# Patient Record
Sex: Female | Born: 1950 | Race: Black or African American | Hispanic: No | Marital: Single | Smoking: Current every day smoker
Health system: Southern US, Community
[De-identification: ages and names within clinical notes are randomized; demographics above are authoritative.]

## PROBLEM LIST (undated history)

## (undated) DIAGNOSIS — R569 Unspecified convulsions: Secondary | ICD-10-CM

## (undated) DIAGNOSIS — I10 Essential (primary) hypertension: Secondary | ICD-10-CM

## (undated) DIAGNOSIS — E785 Hyperlipidemia, unspecified: Secondary | ICD-10-CM

## (undated) DIAGNOSIS — M1990 Unspecified osteoarthritis, unspecified site: Secondary | ICD-10-CM

## (undated) DIAGNOSIS — K219 Gastro-esophageal reflux disease without esophagitis: Secondary | ICD-10-CM

## (undated) HISTORY — DX: Unspecified convulsions: R56.9

## (undated) HISTORY — PX: COLON SURGERY: SHX602

## (undated) HISTORY — PX: JOINT REPLACEMENT: SHX530

## (undated) HISTORY — PX: APPENDECTOMY: SHX54

---

## 2021-05-13 ENCOUNTER — Other Ambulatory Visit: Payer: Self-pay

## 2021-05-13 ENCOUNTER — Encounter (HOSPITAL_BASED_OUTPATIENT_CLINIC_OR_DEPARTMENT_OTHER): Payer: Self-pay | Admitting: *Deleted

## 2021-05-13 ENCOUNTER — Emergency Department (HOSPITAL_BASED_OUTPATIENT_CLINIC_OR_DEPARTMENT_OTHER)
Admission: EM | Admit: 2021-05-13 | Discharge: 2021-05-13 | Disposition: A | Discharge status: Home / Self Care | Payer: 59 | Attending: Emergency Medicine | Admitting: Emergency Medicine

## 2021-05-13 DIAGNOSIS — F17210 Nicotine dependence, cigarettes, uncomplicated: Secondary | ICD-10-CM | POA: Diagnosis not present

## 2021-05-13 DIAGNOSIS — Z91040 Latex allergy status: Secondary | ICD-10-CM | POA: Insufficient documentation

## 2021-05-13 DIAGNOSIS — L255 Unspecified contact dermatitis due to plants, except food: Secondary | ICD-10-CM | POA: Insufficient documentation

## 2021-05-13 DIAGNOSIS — R21 Rash and other nonspecific skin eruption: Secondary | ICD-10-CM | POA: Diagnosis present

## 2021-05-13 DIAGNOSIS — L237 Allergic contact dermatitis due to plants, except food: Secondary | ICD-10-CM

## 2021-05-13 DIAGNOSIS — Z79899 Other long term (current) drug therapy: Secondary | ICD-10-CM | POA: Diagnosis not present

## 2021-05-13 DIAGNOSIS — I10 Essential (primary) hypertension: Secondary | ICD-10-CM | POA: Diagnosis not present

## 2021-05-13 DIAGNOSIS — Z7982 Long term (current) use of aspirin: Secondary | ICD-10-CM | POA: Diagnosis not present

## 2021-05-13 HISTORY — DX: Essential (primary) hypertension: I10

## 2021-05-13 HISTORY — DX: Hyperlipidemia, unspecified: E78.5

## 2021-05-13 HISTORY — DX: Gastro-esophageal reflux disease without esophagitis: K21.9

## 2021-05-13 HISTORY — DX: Unspecified osteoarthritis, unspecified site: M19.90

## 2021-05-13 MED ORDER — PREDNISONE 10 MG (21) PO TBPK: ORAL_TABLET | ORAL | 0 refills | Status: DC

## 2021-05-13 NOTE — Discharge Instructions (Addendum)
Take prednisone as directed.  Follow-up with primary care doctor.  Return to emergency department for any worsening rash, difficulty breathing, tongue or lip swelling or any other worsening concerning symptoms.

## 2021-05-13 NOTE — ED Triage Notes (Signed)
Here for evaluation of a rash, states it is all over her body, first noted this past Saturday, states she has been working in her yard. Has itching, and burning sensation at raised, non draining area.

## 2021-05-13 NOTE — ED Provider Notes (Signed)
[facility] EMERGENCY DEPARTMENT Provider Note   CSN: [id number] Arrival date & time: 05/13/21  1817     History Chief Complaint  Patient presents with   Rash    Yolanda Keith is a 70 y.o. female past medical history arthritis, GERD, hyperlipidemia, hypertension who presents for evaluation of diffuse rash.  She reports a week ago, she was out working in her garden.  She states that she saw poison ivy and she did pick it up.  She reports that the next day, she started having pain, itching, rash noted to her bilateral arms.  She states that the area was pruritic and painful.  She states that it is since spread.  She states that she has been using calamine lotion but it is still very pruritic.  She denies any new medications.  No new soaps, lotions, detergents.  She has not had any swelling of her tongue or lips, difficulty breathing.  The history is provided by the patient.      Past Medical History:  Diagnosis Date   Arthritis    GERD (gastroesophageal reflux disease)    Hyperlipidemia    Hypertension     There are no problems to display for this patient.   Past Surgical History:  Procedure Laterality Date   APPENDECTOMY     COLON SURGERY     JOINT REPLACEMENT       OB History   No obstetric history on file.     History reviewed. No pertinent family history.  Social History   Tobacco Use   Smoking status: Some Days    Types: Cigarettes   Smokeless tobacco: Never  Vaping Use   Vaping Use: Never used  Substance Use Topics   Alcohol use: Yes    Comment: social   Drug use: Never    Home Medications Prior to Admission medications   Medication Sig Start Date End Date Taking? Authorizing Provider  amLODipine (NORVASC) 10 MG tablet Take 10 mg by mouth daily.   Yes [provider]  aspirin 81 MG chewable tablet Chew by mouth daily.   Yes [provider]  busPIRone (BUSPAR) 10 MG tablet Take 10 mg by mouth 3 (three) times daily.   Yes  [provider]  hydrochlorothiazide (HYDRODIURIL) 25 MG tablet Take 25 mg by mouth daily.   Yes [provider]  hydrOXYzine (ATARAX/VISTARIL) 25 MG tablet Take 25 mg by mouth 3 (three) times daily as needed.   Yes [provider]  levETIRAcetam (KEPPRA) 500 MG tablet Take 500 mg by mouth 2 (two) times daily.   Yes [provider]  metoprolol tartrate (LOPRESSOR) 25 MG tablet Take 25 mg by mouth 2 (two) times daily.   Yes [provider]  omeprazole (PRILOSEC) 40 MG capsule Take 40 mg by mouth daily.   Yes [provider]  potassium chloride (KLOR-CON) 20 MEQ packet Take by mouth 2 (two) times daily.   Yes [provider]  predniSONE (STERAPRED UNI-PAK 21 TAB) 10 MG (21) TBPK tablet Take 6 tabs by mouth daily  for 2 days, then 5 tabs for 2 days, then 4 tabs for 2 days, then 3 tabs for 2 days, 2 tabs for 2 days, then 1 tab by mouth daily for 2 days 05/13/21  Yes Graciella Freer A, PA-C  pregabalin (LYRICA) 50 MG capsule Take 50 mg by mouth 3 (three) times daily.   Yes [provider]    Allergies    Penicillins, Latex, Sulfa  antibiotics, and Zinc  Review of Systems   Review of Systems  Constitutional:  Negative for fever.  HENT:  Negative for trouble swallowing.   Respiratory:  Negative for shortness of breath.   Skin:  Positive for rash.  All other systems reviewed and are negative.  Physical Exam Updated Vital Signs BP (!) 164/109 (BP Location: Right Arm)   Pulse 92   Temp 98.2 F (36.8 C) (Oral)   Resp 20   Ht 5\' 3"  (1.6 m)   Wt 72.6 kg   SpO2 98%   BMI 28.34 kg/m   Physical Exam Vitals and nursing note reviewed.  Constitutional:      Appearance: She is well-developed.  HENT:     Head: Normocephalic and atraumatic.     Mouth/Throat:     Comments: Posterior pharynx is clear without any signs of erythema, edema.  Uvula is midline.  Airways patent, phonation is intact. Eyes:     General: No scleral  icterus.       Right eye: No discharge.        Left eye: No discharge.     Conjunctiva/sclera: Conjunctivae normal.  Pulmonary:     Effort: Pulmonary effort is normal.     Comments: Lungs clear to auscultation bilaterally.  Symmetric chest rise.  No wheezing, rales, rhonchi. Skin:    General: Skin is warm and dry.     Comments: Scattered areas of linear erythematous rash with a mix of papules, excoriations to her arms, legs, neck, thighs.  Neurological:     Mental Status: She is alert.  Psychiatric:        Speech: Speech normal.        Behavior: Behavior normal.    ED Results / Procedures / Treatments   Labs (all labs ordered are listed, but only abnormal results are displayed) Labs Reviewed - No data to display  EKG None  Radiology No results found.  Procedures Procedures   Medications Ordered in ED Medications - No data to display  ED Course  I have reviewed the triage vital signs and the nursing notes.  Pertinent labs & imaging results that were available during my care of the patient were reviewed by me and considered in my medical decision making (see chart for details).    MDM Rules/Calculators/A&P                           70 year old female who presents for evaluation of diffuse rash that has been ongoing for about a week.  She states that it is pruritic in nature.  She reports that prior to onset of rash, she was in the garden and noted to be exposed to poison ivy.  No fever, oral angioedema, difficulty breathing.  On initial arrival, she is afebrile nontoxic-appearing.  Vital signs are stable.  On exam, she has scattered excoriations, linear numbness papules consistent with poison ivy noted to her arms, neck, legs.  No oral lesions.  No oral angioedema.  History/physical exam concerning for shingles, cellulitis, scabies.  We will plan to treat with a prednisone taper.  Patient with no history of diabetes. At this time, patient exhibits no emergent  life-threatening condition that require further evaluation in ED. Patient had ample opportunity for questions and discussion. All patient's questions were answered with full understanding. Strict return precautions discussed. Patient expresses understanding and agreement to plan.   Portions of this note were generated with 78. Dictation errors  may occur despite best attempts at proofreading.   Final Clinical Impression(s) / ED Diagnoses Final diagnoses:  Poison ivy    Rx / DC Orders ED Discharge Orders          Ordered    predniSONE (STERAPRED UNI-PAK 21 TAB) 10 MG (21) TBPK tablet        05/13/21 1855             Maxwell Caul, PA-C 05/13/21 2309    Virgina Norfolk, DO 05/16/21 1502

## 2021-05-23 ENCOUNTER — Other Ambulatory Visit: Payer: Self-pay

## 2021-05-23 ENCOUNTER — Other Ambulatory Visit (HOSPITAL_BASED_OUTPATIENT_CLINIC_OR_DEPARTMENT_OTHER): Payer: Self-pay

## 2021-05-23 ENCOUNTER — Emergency Department (HOSPITAL_BASED_OUTPATIENT_CLINIC_OR_DEPARTMENT_OTHER)
Admission: EM | Admit: 2021-05-23 | Discharge: 2021-05-23 | Disposition: A | Discharge status: Home / Self Care | Payer: Medicare Other | Attending: Emergency Medicine | Admitting: Emergency Medicine

## 2021-05-23 ENCOUNTER — Emergency Department (HOSPITAL_BASED_OUTPATIENT_CLINIC_OR_DEPARTMENT_OTHER): Payer: Medicare Other

## 2021-05-23 ENCOUNTER — Encounter (HOSPITAL_BASED_OUTPATIENT_CLINIC_OR_DEPARTMENT_OTHER): Payer: Self-pay

## 2021-05-23 DIAGNOSIS — Z7982 Long term (current) use of aspirin: Secondary | ICD-10-CM | POA: Insufficient documentation

## 2021-05-23 DIAGNOSIS — Z91040 Latex allergy status: Secondary | ICD-10-CM | POA: Diagnosis not present

## 2021-05-23 DIAGNOSIS — S8011XA Contusion of right lower leg, initial encounter: Secondary | ICD-10-CM | POA: Diagnosis not present

## 2021-05-23 DIAGNOSIS — L237 Allergic contact dermatitis due to plants, except food: Secondary | ICD-10-CM | POA: Insufficient documentation

## 2021-05-23 DIAGNOSIS — X58XXXA Exposure to other specified factors, initial encounter: Secondary | ICD-10-CM | POA: Diagnosis not present

## 2021-05-23 DIAGNOSIS — I10 Essential (primary) hypertension: Secondary | ICD-10-CM | POA: Diagnosis not present

## 2021-05-23 DIAGNOSIS — S40022A Contusion of left upper arm, initial encounter: Secondary | ICD-10-CM | POA: Diagnosis not present

## 2021-05-23 DIAGNOSIS — F17210 Nicotine dependence, cigarettes, uncomplicated: Secondary | ICD-10-CM | POA: Diagnosis not present

## 2021-05-23 DIAGNOSIS — Z79899 Other long term (current) drug therapy: Secondary | ICD-10-CM | POA: Insufficient documentation

## 2021-05-23 DIAGNOSIS — M25551 Pain in right hip: Secondary | ICD-10-CM | POA: Diagnosis not present

## 2021-05-23 DIAGNOSIS — M25461 Effusion, right knee: Secondary | ICD-10-CM | POA: Diagnosis not present

## 2021-05-23 DIAGNOSIS — S8991XA Unspecified injury of right lower leg, initial encounter: Secondary | ICD-10-CM | POA: Diagnosis present

## 2021-05-23 DIAGNOSIS — Z96653 Presence of artificial knee joint, bilateral: Secondary | ICD-10-CM | POA: Insufficient documentation

## 2021-05-23 LAB — BASIC METABOLIC PANEL
Anion gap: 9 (ref 5–15)
BUN: 30 mg/dL — ABNORMAL HIGH (ref 8–23)
CO2: 29 mmol/L (ref 22–32)
Calcium: 9.6 mg/dL (ref 8.9–10.3)
Chloride: 102 mmol/L (ref 98–111)
Creatinine, Ser: 0.96 mg/dL (ref 0.44–1.00)
GFR, Estimated: >60 mL/min (ref >60)
Glucose, Bld: 113 mg/dL — ABNORMAL HIGH (ref 70–99)
Potassium: 4.8 mmol/L (ref 3.5–5.1)
Sodium: 140 mmol/L (ref 135–145)

## 2021-05-23 LAB — APTT: aPTT: 28 seconds (ref 24–36)

## 2021-05-23 LAB — CBC WITH DIFFERENTIAL/PLATELET
Abs Immature Granulocytes: 0.25 10*3/uL — ABNORMAL HIGH (ref 0.00–0.07)
Basophils Absolute: 0 10*3/uL (ref 0.0–0.1)
Basophils Relative: 0 %
Eosinophils Absolute: 0 10*3/uL (ref 0.0–0.5)
Eosinophils Relative: 0 %
HCT: 36.2 % (ref 36.0–46.0)
Hemoglobin: 11.6 g/dL — ABNORMAL LOW (ref 12.0–15.0)
Immature Granulocytes: 2 %
Lymphocytes Relative: 14 %
Lymphs Abs: 2.1 10*3/uL (ref 0.7–4.0)
MCH: 27.9 pg (ref 26.0–34.0)
MCHC: 32 g/dL (ref 30.0–36.0)
MCV: 87 fL (ref 80.0–100.0)
Monocytes Absolute: 0.4 10*3/uL (ref 0.1–1.0)
Monocytes Relative: 3 %
Neutro Abs: 11.8 10*3/uL — ABNORMAL HIGH (ref 1.7–7.7)
Neutrophils Relative %: 81 %
Platelets: 234 10*3/uL (ref 150–400)
RBC: 4.16 MIL/uL (ref 3.87–5.11)
RDW: 13.6 % (ref 11.5–15.5)
WBC: 14.6 10*3/uL — ABNORMAL HIGH (ref 4.0–10.5)
nRBC: 0 % (ref 0.0–0.2)

## 2021-05-23 LAB — PROTIME-INR
INR: 0.8 (ref 0.8–1.2)
Prothrombin Time: 11.3 seconds — ABNORMAL LOW (ref 11.4–15.2)

## 2021-05-23 MED ORDER — PREDNISONE 20 MG PO TABS
20.0000 mg | ORAL_TABLET | Freq: Every day | ORAL | 0 refills | Status: DC
  Filled 2021-05-23: qty 3, 3d supply, fill #0

## 2021-05-23 MED ORDER — HYDROCODONE-ACETAMINOPHEN 5-325 MG PO TABS
1.0000 | ORAL_TABLET | Freq: Once | ORAL | Status: AC
  Administered 2021-05-23: 1 via ORAL
  Filled 2021-05-23: qty 1

## 2021-05-23 MED ORDER — HYDROCODONE-ACETAMINOPHEN 5-325 MG PO TABS
1.0000 | ORAL_TABLET | Freq: Four times a day (QID) | ORAL | 0 refills | Status: DC | PRN
  Filled 2021-05-23: qty 10, 3d supply, fill #0

## 2021-05-23 NOTE — ED Notes (Signed)
Acute on chronic right hip/leg pain, patient also reports itching from poison ivy which she was seen for here last week and has 1 more day of steroids left.

## 2021-05-23 NOTE — ED Notes (Signed)
Patient transported to CT 

## 2021-05-23 NOTE — Discharge Instructions (Addendum)
A few more days of prednisone for the poison ivy but you were also given some pain medication for the knee and hip but looks like your arthritis is acting up.  Use the knee brace to help support the leg but also follow up with your new doctor in the future because you may need physical therapy.

## 2021-05-23 NOTE — ED Provider Notes (Signed)
[facility] EMERGENCY DEPARTMENT Provider Note   CSN: [id number] Arrival date & time: 05/23/21  1209     History Chief Complaint  Patient presents with   Rash    Yolanda Keith is a 70 y.o. female.  Patient is a 70 year old female with a history of hypertension, hyperlipidemia, chronic pain with constant back issues, bilateral knee replacements and back and pelvis pain frequently who was seen in the emergency department last week due to poison ivy and has been on a prednisone taper but she returns today because of ongoing itching and issues with poison ivy over her hands as well as 3 days of more severe right hip pain that radiates down to the knee, worse with walking and her knee is intermittently giving out.  She has tried over-the-counter medication but it does not seem to help with the pain.  Walking makes it worse it is better at rest.  She did have a fall 2 weeks ago and has had pain but much more severe in the last few days.  No trauma since that time.  No numbness or swelling in the leg but she does report it feels a little bit more weak.  No symptoms in the left leg today.  No right arm numbness or facial symptoms.  No speech difficulty.  Other than the prednisone no other change in medications.  She has also noticed significant bruising that she is not sure where it came from. Still currently on prednisone  The history is provided by the patient and medical records.  Rash     Past Medical History:  Diagnosis Date   Arthritis    GERD (gastroesophageal reflux disease)    Hyperlipidemia    Hypertension     There are no problems to display for this patient.   Past Surgical History:  Procedure Laterality Date   APPENDECTOMY     COLON SURGERY     JOINT REPLACEMENT       OB History   No obstetric history on file.     No family history on file.  Social History   Tobacco Use   Smoking status: Some Days    Types: Cigarettes   Smokeless tobacco: Never   Vaping Use   Vaping Use: Never used  Substance Use Topics   Alcohol use: Yes    Comment: social   Drug use: Never    Home Medications Prior to Admission medications   Medication Sig Start Date End Date Taking? Authorizing Provider  amLODipine (NORVASC) 10 MG tablet Take 10 mg by mouth daily.    [provider]  aspirin 81 MG chewable tablet Chew by mouth daily.    [provider]  busPIRone (BUSPAR) 10 MG tablet Take 10 mg by mouth 3 (three) times daily.    [provider]  hydrochlorothiazide (HYDRODIURIL) 25 MG tablet Take 25 mg by mouth daily.    [provider]  hydrOXYzine (ATARAX/VISTARIL) 25 MG tablet Take 25 mg by mouth 3 (three) times daily as needed.    [provider]  levETIRAcetam (KEPPRA) 500 MG tablet Take 500 mg by mouth 2 (two) times daily.    [provider]  metoprolol tartrate (LOPRESSOR) 25 MG tablet Take 25 mg by mouth 2 (two) times daily.    [provider]  omeprazole (PRILOSEC) 40 MG capsule Take 40 mg by mouth daily.    [provider]  potassium chloride (KLOR-CON) 20 MEQ packet Take by mouth 2 (two) times daily.  [provider]  predniSONE (STERAPRED UNI-PAK 21 TAB) 10 MG (21) TBPK tablet Take 6 tabs by mouth daily  for 2 days, then 5 tabs for 2 days, then 4 tabs for 2 days, then 3 tabs for 2 days, 2 tabs for 2 days, then 1 tab by mouth daily for 2 days 05/13/21   Graciella Freer A, PA-C  pregabalin (LYRICA) 50 MG capsule Take 50 mg by mouth 3 (three) times daily.    [provider]    Allergies    Penicillins, Latex, Sulfa antibiotics, and Zinc  Review of Systems   Review of Systems  Skin:  Positive for rash.  All other systems reviewed and are negative.  Physical Exam Updated Vital Signs BP 121/86 (BP Location: Right Arm)   Pulse 74   Temp 99.6 F (37.6 C) (Oral)   Resp 20   Ht 5' 3.5" (1.613 m)   Wt 72.6 kg   SpO2 96%   BMI 27.90 kg/m   Physical  Exam Vitals and nursing note reviewed.  Constitutional:      General: She is in acute distress.     Appearance: She is well-developed.  HENT:     Head: Normocephalic and atraumatic.     Mouth/Throat:     Mouth: Mucous membranes are moist.  Eyes:     Pupils: Pupils are equal, round, and reactive to light.  Cardiovascular:     Rate and Rhythm: Normal rate and regular rhythm.     Heart sounds: Normal heart sounds. No murmur heard.   No friction rub.  Pulmonary:     Effort: Pulmonary effort is normal.     Breath sounds: Normal breath sounds. No wheezing or rales.  Abdominal:     General: Bowel sounds are normal. There is no distension.     Palpations: Abdomen is soft.     Tenderness: There is no abdominal tenderness. There is no guarding or rebound.  Musculoskeletal:        General: No tenderness. Normal range of motion.     Cervical back: Normal range of motion and neck supple. No tenderness.     Comments: No edema.  Pain with ROM of the right hip.  Tenderness with axial loading of the right foot.  Able to lift the leg but unclear if theres is true weakness or related to pain as pt can push down firmly and normal foot flex/ext.  Right knee with mild tenderness but no swelling and ROM to >90 degrees of flexion without difficulty.  Skin:    General: Skin is warm and dry.     Findings: Rash present.     Comments: Dried excoriated patches over bilateral hands and behind the left ear and neck area.  No active area of lesions.  Large area of ecchymosis over the right calf and the left upper arm  Neurological:     Mental Status: She is alert and oriented to person, place, and time.     Cranial Nerves: No cranial nerve deficit.     Sensory: No sensory deficit.     Motor: No weakness.     Coordination: Coordination normal.     Comments: No speech deficits.  No facial droop or sensation abnormalities.  Psychiatric:        Behavior: Behavior normal.    ED Results / Procedures / Treatments    Labs (all labs ordered are listed, but only abnormal results are displayed) Labs Reviewed  CBC WITH DIFFERENTIAL/PLATELET - Abnormal; Notable  for the following components:      Result Value   WBC 14.6 (*)    Hemoglobin 11.6 (*)    Neutro Abs 11.8 (*)    Abs Immature Granulocytes 0.25 (*)    All other components within normal limits  BASIC METABOLIC PANEL - Abnormal; Notable for the following components:   Glucose, Bld 113 (*)    BUN 30 (*)    All other components within normal limits  PROTIME-INR - Abnormal; Notable for the following components:   Prothrombin Time 11.3 (*)    All other components within normal limits  APTT    EKG None  Radiology DG Knee Complete 4 Views Right  Result Date: 05/23/2021 CLINICAL DATA:  Pain EXAM: RIGHT KNEE - COMPLETE 4+ VIEW COMPARISON:  None. FINDINGS: Right total knee arthroplasty is present. Alignment is anatomic. No periprosthetic fracture. Possible joint effusion. Superior patellar spurring. Vascular calcifications. IMPRESSION: Right total knee arthroplasty without evidence of complication. Possible joint effusion. Electronically Signed   By: Guadlupe Spanish M.D.   On: 05/23/2021 13:32   DG Hip Unilat W or Wo Pelvis 2-3 Views Right  Result Date: 05/23/2021 CLINICAL DATA:  pain EXAM: DG HIP (WITH OR WITHOUT PELVIS) 2-3V RIGHT COMPARISON:  None. FINDINGS: No evidence of acute fracture or joint malalignment. Mild bilateral hip degenerative change. Partially imaged lower lumbar degenerative change. Mild bilateral sacroiliac and pubic symphysis degenerative change. Atherosclerotic vascular calcifications. Possible chain sutures projecting along the midline anatomic pelvis. IMPRESSION: 1. No evidence of acute fracture or traumatic malalignment 2. Mild bilateral hip degenerative change. Electronically Signed   By: Feliberto Harts MD   On: 05/23/2021 13:29    Procedures Procedures   Medications Ordered in ED Medications  HYDROcodone-acetaminophen  (NORCO/VICODIN) 5-325 MG per tablet 1 tablet (has no administration in time range)    ED Course  I have reviewed the triage vital signs and the nursing notes.  Pertinent labs & imaging results that were available during my care of the patient were reviewed by me and considered in my medical decision making (see chart for details).    MDM Rules/Calculators/A&P                           Patient is a 70 year old female presenting today with 2 separate issues.  She initially came feeling like her poison ivy was not getting better.  Patient is still currently on prednisone and most of the patches of poison ivy on her hands and on her neck and under her ears appears to be drying up and improving.  She secondly complains of pain in her right hip that is much worse in the last 3 to 4 days and her knee has been giving out.  She did have a fall 2 weeks ago but no other trauma.  She has chronic back and leg pain and 2 prior knee replacements on the right.  She has no evidence of swelling or concern for DVT at this time.  Large area of ecchymosis over the right calf which is mildly tender to palpation.  Patient can lift her leg off the bed but appears to be painful.  Unclear if there is true weakness versus significant pain.  It is more painful with passive range of motion.  Some pain in the hip with axial loading.  Patient can fully flex and extend the right knee actively without requiring any help.  No evidence of septic joint at this time.  Possibility for a lumbar radiculopathy versus acute hip pathology.  Patient is a also concern for knee issues.  Pulses intact no distal edema.  We will send labs to ensure no evidence of low platelets or bleeding dyscrasia given the recent bruising.  We will also check to ensure blood sugar is okay with recent prednisone use.  Patient given a dose of pain control.  Otherwise well-appearing here.  No foot drop today or symptoms to suggest stroke.  11:22 PM Mild leukocytosis  due to recent steroid use but stable hemoglobin, blood sugar is normal.  X-ray show evidence of arthritis and mild effusion but no other acute issues at this time.  Patient placed in a knee sleeve.  Encouraged follow-up with PCP and given a short course of pain control.  MDM   Amount and/or Complexity of Data Reviewed Clinical lab tests: ordered and reviewed Tests in the radiology section of CPT: ordered and reviewed Independent visualization of images, tracings, or specimens: yes    Final Clinical Impression(s) / ED Diagnoses Final diagnoses:  Poison ivy dermatitis  Effusion of right knee joint  Right hip pain    Rx / DC Orders ED Discharge Orders          Ordered    HYDROcodone-acetaminophen (NORCO/VICODIN) 5-325 MG tablet  Every 6 hours PRN        05/23/21 1534    predniSONE (DELTASONE) 20 MG tablet  Daily        05/23/21 1534             Gwyneth SproutPlunkett, Brycelyn Gambino, MD 05/24/21 2322

## 2021-05-23 NOTE — ED Triage Notes (Signed)
"  Was here a week ago for poison ivy, finished steroids, but rash is getting worse and I am getting weakness in my right leg" per pt

## 2021-11-12 ENCOUNTER — Encounter (HOSPITAL_BASED_OUTPATIENT_CLINIC_OR_DEPARTMENT_OTHER): Payer: Self-pay | Admitting: Emergency Medicine

## 2021-11-12 ENCOUNTER — Other Ambulatory Visit: Payer: Self-pay

## 2021-11-12 ENCOUNTER — Emergency Department (HOSPITAL_BASED_OUTPATIENT_CLINIC_OR_DEPARTMENT_OTHER): Payer: Medicare Other

## 2021-11-12 ENCOUNTER — Emergency Department (HOSPITAL_BASED_OUTPATIENT_CLINIC_OR_DEPARTMENT_OTHER)
Admission: EM | Admit: 2021-11-12 | Discharge: 2021-11-12 | Disposition: A | Discharge status: Home / Self Care | Payer: Medicare Other | Source: Home / Self Care | Attending: Emergency Medicine | Admitting: Emergency Medicine

## 2021-11-12 DIAGNOSIS — W19XXXA Unspecified fall, initial encounter: Secondary | ICD-10-CM | POA: Insufficient documentation

## 2021-11-12 DIAGNOSIS — S3210XA Unspecified fracture of sacrum, initial encounter for closed fracture: Secondary | ICD-10-CM | POA: Insufficient documentation

## 2021-11-12 DIAGNOSIS — Z79899 Other long term (current) drug therapy: Secondary | ICD-10-CM | POA: Insufficient documentation

## 2021-11-12 DIAGNOSIS — Z91040 Latex allergy status: Secondary | ICD-10-CM | POA: Insufficient documentation

## 2021-11-12 DIAGNOSIS — Z7982 Long term (current) use of aspirin: Secondary | ICD-10-CM | POA: Insufficient documentation

## 2021-11-12 DIAGNOSIS — K2970 Gastritis, unspecified, without bleeding: Secondary | ICD-10-CM | POA: Diagnosis not present

## 2021-11-12 DIAGNOSIS — R1011 Right upper quadrant pain: Secondary | ICD-10-CM | POA: Diagnosis not present

## 2021-11-12 MED ORDER — HYDROCODONE-ACETAMINOPHEN 5-325 MG PO TABS
2.0000 | ORAL_TABLET | Freq: Once | ORAL | Status: AC
  Administered 2021-11-12: 2 via ORAL
  Filled 2021-11-12: qty 2
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
## 2021-11-12 NOTE — Discharge Instructions (Signed)
Your x-ray shows a fracture of your sacrum.  If you develop uncontrolled pain, weakness or numbness or pain going down your legs, incontinence of your bladder or bowels, or any other new/concerning symptoms then return to the ER for evaluation.

## 2021-11-12 NOTE — ED Notes (Signed)
Patient discharged to home.  All discharge instructions reviewed.  Patient verbalized understanding via teachback method.  VS WDL.  Respirations even and unlabored.  Wheelchair out of ED.   ?

## 2021-11-12 NOTE — ED Triage Notes (Signed)
Pt arrives pov, to triage in wheelchair, c/o fall x 3 days pta. Pt c/o right elbow pain and  and coccyx pain. Pt denies hitting head, or thinners

## 2021-11-14 ENCOUNTER — Emergency Department (HOSPITAL_BASED_OUTPATIENT_CLINIC_OR_DEPARTMENT_OTHER): Payer: Medicare Other

## 2021-11-14 ENCOUNTER — Inpatient Hospital Stay (HOSPITAL_BASED_OUTPATIENT_CLINIC_OR_DEPARTMENT_OTHER)
Admission: EM | Admit: 2021-11-14 | Discharge: 2021-11-18 | DRG: 392 | Disposition: A | Discharge status: Home / Self Care | Payer: Medicare Other | Attending: Family Medicine | Admitting: Family Medicine

## 2021-11-14 ENCOUNTER — Encounter (HOSPITAL_BASED_OUTPATIENT_CLINIC_OR_DEPARTMENT_OTHER): Payer: Self-pay

## 2021-11-14 ENCOUNTER — Other Ambulatory Visit: Payer: Self-pay

## 2021-11-14 DIAGNOSIS — Z96653 Presence of artificial knee joint, bilateral: Secondary | ICD-10-CM | POA: Diagnosis present

## 2021-11-14 DIAGNOSIS — Z7982 Long term (current) use of aspirin: Secondary | ICD-10-CM

## 2021-11-14 DIAGNOSIS — I10 Essential (primary) hypertension: Secondary | ICD-10-CM | POA: Diagnosis present

## 2021-11-14 DIAGNOSIS — K219 Gastro-esophageal reflux disease without esophagitis: Secondary | ICD-10-CM | POA: Diagnosis present

## 2021-11-14 DIAGNOSIS — Z79899 Other long term (current) drug therapy: Secondary | ICD-10-CM

## 2021-11-14 DIAGNOSIS — Z888 Allergy status to other drugs, medicaments and biological substances status: Secondary | ICD-10-CM

## 2021-11-14 DIAGNOSIS — E872 Acidosis, unspecified: Secondary | ICD-10-CM | POA: Diagnosis present

## 2021-11-14 DIAGNOSIS — K810 Acute cholecystitis: Secondary | ICD-10-CM | POA: Insufficient documentation

## 2021-11-14 DIAGNOSIS — K2970 Gastritis, unspecified, without bleeding: Principal | ICD-10-CM | POA: Diagnosis present

## 2021-11-14 DIAGNOSIS — Z882 Allergy status to sulfonamides status: Secondary | ICD-10-CM | POA: Diagnosis not present

## 2021-11-14 DIAGNOSIS — G928 Other toxic encephalopathy: Secondary | ICD-10-CM

## 2021-11-14 DIAGNOSIS — E860 Dehydration: Secondary | ICD-10-CM | POA: Diagnosis not present

## 2021-11-14 DIAGNOSIS — A419 Sepsis, unspecified organism: Secondary | ICD-10-CM | POA: Diagnosis not present

## 2021-11-14 DIAGNOSIS — S3210XA Unspecified fracture of sacrum, initial encounter for closed fracture: Secondary | ICD-10-CM | POA: Diagnosis present

## 2021-11-14 DIAGNOSIS — G40909 Epilepsy, unspecified, not intractable, without status epilepticus: Secondary | ICD-10-CM | POA: Diagnosis present

## 2021-11-14 DIAGNOSIS — E876 Hypokalemia: Secondary | ICD-10-CM | POA: Diagnosis present

## 2021-11-14 DIAGNOSIS — Z20822 Contact with and (suspected) exposure to covid-19: Secondary | ICD-10-CM | POA: Diagnosis present

## 2021-11-14 DIAGNOSIS — R6520 Severe sepsis without septic shock: Secondary | ICD-10-CM | POA: Diagnosis not present

## 2021-11-14 DIAGNOSIS — R52 Pain, unspecified: Secondary | ICD-10-CM

## 2021-11-14 DIAGNOSIS — E785 Hyperlipidemia, unspecified: Secondary | ICD-10-CM | POA: Diagnosis present

## 2021-11-14 DIAGNOSIS — R112 Nausea with vomiting, unspecified: Secondary | ICD-10-CM

## 2021-11-14 DIAGNOSIS — Z91040 Latex allergy status: Secondary | ICD-10-CM

## 2021-11-14 DIAGNOSIS — F17210 Nicotine dependence, cigarettes, uncomplicated: Secondary | ICD-10-CM | POA: Diagnosis present

## 2021-11-14 DIAGNOSIS — R1011 Right upper quadrant pain: Secondary | ICD-10-CM | POA: Diagnosis present

## 2021-11-14 DIAGNOSIS — M1990 Unspecified osteoarthritis, unspecified site: Secondary | ICD-10-CM | POA: Diagnosis present

## 2021-11-14 DIAGNOSIS — R109 Unspecified abdominal pain: Secondary | ICD-10-CM

## 2021-11-14 DIAGNOSIS — G4700 Insomnia, unspecified: Secondary | ICD-10-CM | POA: Diagnosis not present

## 2021-11-14 DIAGNOSIS — Z880 Allergy status to penicillin: Secondary | ICD-10-CM | POA: Diagnosis not present

## 2021-11-14 DIAGNOSIS — Z7952 Long term (current) use of systemic steroids: Secondary | ICD-10-CM | POA: Diagnosis not present

## 2021-11-14 LAB — COMPREHENSIVE METABOLIC PANEL
ALT: 21 U/L (ref 0–44)
AST: 36 U/L (ref 15–41)
Albumin: 5.3 g/dL — ABNORMAL HIGH (ref 3.5–5.0)
Alkaline Phosphatase: 88 U/L (ref 38–126)
Anion gap: 15 (ref 5–15)
BUN: 12 mg/dL (ref 8–23)
CO2: 30 mmol/L (ref 22–32)
Calcium: 10.9 mg/dL — ABNORMAL HIGH (ref 8.9–10.3)
Chloride: 96 mmol/L — ABNORMAL LOW (ref 98–111)
Creatinine, Ser: 0.93 mg/dL (ref 0.44–1.00)
GFR, Estimated: >60 mL/min (ref >60)
Glucose, Bld: 137 mg/dL — ABNORMAL HIGH (ref 70–99)
Potassium: 3.1 mmol/L — ABNORMAL LOW (ref 3.5–5.1)
Sodium: 141 mmol/L (ref 135–145)
Total Bilirubin: 0.6 mg/dL (ref 0.3–1.2)
Total Protein: 8.6 g/dL — ABNORMAL HIGH (ref 6.5–8.1)

## 2021-11-14 LAB — RESP PANEL BY RT-PCR (FLU A&B, COVID) ARPGX2
Influenza A by PCR: NEGATIVE
Influenza B by PCR: NEGATIVE
SARS Coronavirus 2 by RT PCR: NEGATIVE

## 2021-11-14 LAB — CBC
HCT: 44.1 % (ref 36.0–46.0)
Hemoglobin: 14.5 g/dL (ref 12.0–15.0)
MCH: 27.6 pg (ref 26.0–34.0)
MCHC: 32.9 g/dL (ref 30.0–36.0)
MCV: 83.8 fL (ref 80.0–100.0)
Platelets: 250 10*3/uL (ref 150–400)
RBC: 5.26 MIL/uL — ABNORMAL HIGH (ref 3.87–5.11)
RDW: 13.5 % (ref 11.5–15.5)
WBC: 10 10*3/uL (ref 4.0–10.5)
nRBC: 0 % (ref 0.0–0.2)

## 2021-11-14 LAB — URINALYSIS, ROUTINE W REFLEX MICROSCOPIC
Bilirubin Urine: NEGATIVE
Glucose, UA: NEGATIVE mg/dL
Ketones, ur: NEGATIVE mg/dL
Leukocytes,Ua: NEGATIVE
Nitrite: NEGATIVE
Protein, ur: 100 mg/dL — AB
Specific Gravity, Urine: 1.02 (ref 1.005–1.030)
pH: 8.5 — ABNORMAL HIGH (ref 5.0–8.0)

## 2021-11-14 LAB — PROTIME-INR
INR: 0.9 (ref 0.8–1.2)
Prothrombin Time: 12.6 seconds (ref 11.4–15.2)

## 2021-11-14 LAB — CBG MONITORING, ED
Glucose-Capillary: 167 mg/dL — ABNORMAL HIGH (ref 70–99)
Glucose-Capillary: 116 mg/dL — ABNORMAL HIGH (ref 70–99)

## 2021-11-14 LAB — URINALYSIS, MICROSCOPIC (REFLEX): WBC, UA: NONE SEEN WBC/hpf (ref 0–5)

## 2021-11-14 LAB — AMMONIA: Ammonia: 25 umol/L (ref 9–35)

## 2021-11-14 LAB — LACTIC ACID, PLASMA
Lactic Acid, Venous: 3.8 mmol/L (ref 0.5–1.9)
Lactic Acid, Venous: 1.5 mmol/L (ref 0.5–1.9)

## 2021-11-14 LAB — MRSA NEXT GEN BY PCR, NASAL: MRSA by PCR Next Gen: NOT DETECTED

## 2021-11-14 LAB — APTT: aPTT: 33 seconds (ref 24–36)

## 2021-11-14 LAB — LIPASE, BLOOD: Lipase: 27 U/L (ref 11–51)

## 2021-11-14 MED ORDER — VANCOMYCIN HCL IN DEXTROSE 1-5 GM/200ML-% IV SOLN
1000.0000 mg | Freq: Once | INTRAVENOUS | Status: AC
  Administered 2021-11-14: 1000 mg via INTRAVENOUS

## 2021-11-14 MED ORDER — SODIUM CHLORIDE 0.9 % IV BOLUS (SEPSIS)
1000.0000 mL | Freq: Once | INTRAVENOUS | Status: AC
  Administered 2021-11-14: 1000 mL via INTRAVENOUS

## 2021-11-14 MED ORDER — VANCOMYCIN HCL IN DEXTROSE 1-5 GM/200ML-% IV SOLN
1000.0000 mg | INTRAVENOUS | Status: DC
  Filled 2021-11-14: qty 200

## 2021-11-14 MED ORDER — FENTANYL CITRATE PF 50 MCG/ML IJ SOSY
50.0000 ug | PREFILLED_SYRINGE | Freq: Once | INTRAMUSCULAR | Status: AC
  Administered 2021-11-14: 50 ug via INTRAVENOUS

## 2021-11-14 MED ORDER — SODIUM CHLORIDE 0.9 % IV BOLUS
1000.0000 mL | Freq: Once | INTRAVENOUS | Status: AC
  Administered 2021-11-14: 1000 mL via INTRAVENOUS

## 2021-11-14 MED ORDER — ONDANSETRON 4 MG PO TBDP
4.0000 mg | ORAL_TABLET | Freq: Once | ORAL | Status: AC
  Administered 2021-11-14: 4 mg via ORAL
  Filled 2021-11-14: qty 1

## 2021-11-14 MED ORDER — SODIUM CHLORIDE 0.9 % IV SOLN
2.0000 g | Freq: Three times a day (TID) | INTRAVENOUS | Status: DC
  Administered 2021-11-15 (×2): 2 g via INTRAVENOUS
  Filled 2021-11-14 (×2): qty 2

## 2021-11-14 MED ORDER — FENTANYL CITRATE PF 50 MCG/ML IJ SOSY
50.0000 ug | PREFILLED_SYRINGE | Freq: Once | INTRAMUSCULAR | Status: AC
  Administered 2021-11-14: 50 ug via INTRAVENOUS
  Filled 2021-11-14: qty 1

## 2021-11-14 MED ORDER — ACETAMINOPHEN 325 MG PO TABS
650.0000 mg | ORAL_TABLET | Freq: Once | ORAL | Status: AC
  Administered 2021-11-14: 650 mg via ORAL
  Filled 2021-11-14: qty 2

## 2021-11-14 MED ORDER — IOHEXOL 300 MG/ML  SOLN
100.0000 mL | Freq: Once | INTRAMUSCULAR | Status: AC | PRN
  Administered 2021-11-14: 100 mL via INTRAVENOUS

## 2021-11-14 MED ORDER — FENTANYL CITRATE PF 50 MCG/ML IJ SOSY
50.0000 ug | PREFILLED_SYRINGE | Freq: Once | INTRAMUSCULAR | Status: DC
  Filled 2021-11-14: qty 1

## 2021-11-14 MED ORDER — METRONIDAZOLE 500 MG/100ML IV SOLN
500.0000 mg | Freq: Once | INTRAVENOUS | Status: AC
  Administered 2021-11-14: 500 mg via INTRAVENOUS
  Filled 2021-11-14: qty 100

## 2021-11-14 MED ORDER — SODIUM CHLORIDE 0.9 % IV SOLN
2.0000 g | Freq: Once | INTRAVENOUS | Status: AC
  Administered 2021-11-14: 2 g via INTRAVENOUS

## 2021-11-14 MED ORDER — SODIUM CHLORIDE 0.9 % IV BOLUS (SEPSIS)
500.0000 mL | Freq: Once | INTRAVENOUS | Status: AC
  Administered 2021-11-14: 500 mL via INTRAVENOUS

## 2021-11-14 NOTE — Progress Notes (Signed)
Pharmacy Antibiotic Note  Yolanda Keith is a 71 y.o. female admitted on 11/14/2021 with sepsis.  Pharmacy has been consulted for Vancomcyin/aztreonam dosing.  Penicillin allergy with hives. No history of cephalosporin use documented Scr 0.93 (seems at baseline)  Plan: Vancomycin 1000mg  q24hr (eAUC 580) Aztreonam 2gm q8hr Plan to obtain levels at steady state if therapy continued Will monitor for acute changes in renal function and adjust as needed F/u cultures results and de-escalate as appropriate    Temp (24hrs), Avg:100.6 F (38.1 C), Min:100.4 F (38 C), Max:100.7 F (38.2 C)  Recent Labs  Lab 11/14/21 1418 11/14/21 1621  WBC 10.0  --   CREATININE 0.93  --   LATICACIDVEN  --  3.8*    Estimated Creatinine Clearance: 57 mL/min (by C-G formula based on SCr of 0.93 mg/dL).    Allergies  Allergen Reactions   Penicillins Hives   Latex Rash   Sulfa Antibiotics    Zinc     Shakes, restlessness    Antimicrobials this admission: Vancomycin 1/30 >>  Aztreonem 1/30 >>  Metronidazole 1/30>>   Microbiology results: 1/30 BCx: IP 1/30 MRSA nares: ordered  Thank you for allowing pharmacy to be a part of this patients care.  2/30, PharmD Clinical Pharmacist  Please check AMION for all [facility] Pharmacy numbers After 10:00 PM, call Main Pharmacy [phone]

## 2021-11-14 NOTE — ED Notes (Signed)
Patient transported to CT 

## 2021-11-14 NOTE — ED Triage Notes (Signed)
Pt c/o vomiting, chills started last night-c/o multiple pain sites from recent fall-seen here for fall 1/28-daughter states she thinks pt may have had a seizure/appeared post ictal ~130pm-pt states she is unsure/family in the home did not report known seizure activity- to triage in w/c-NAD

## 2021-11-14 NOTE — Sepsis Progress Note (Signed)
[facility] monitoring sepsis protocol 

## 2021-11-14 NOTE — ED Notes (Signed)
Report given to [facility]. 

## 2021-11-14 NOTE — ED Notes (Signed)
Patient nauseated and restless with frequent vomiting upon arrival to room.  Requested PA to see pt.  Per V.O. gave zofran SL.  Required significant time and assistance with undressing and cleaning up.

## 2021-11-14 NOTE — ED Notes (Signed)
Report [facility] to [facility] RN.
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
## 2021-11-14 NOTE — ED Notes (Signed)
Pt is a hard stick, stuck x1 rainbow lab tubes were obtain in triage.

## 2021-11-14 NOTE — ED Notes (Signed)
[facility] at bedside 

## 2021-11-15 ENCOUNTER — Encounter (HOSPITAL_COMMUNITY): Payer: Self-pay | Admitting: Internal Medicine

## 2021-11-15 ENCOUNTER — Inpatient Hospital Stay (HOSPITAL_COMMUNITY): Payer: Medicare Other

## 2021-11-15 DIAGNOSIS — R1011 Right upper quadrant pain: Secondary | ICD-10-CM | POA: Diagnosis present

## 2021-11-15 DIAGNOSIS — R112 Nausea with vomiting, unspecified: Secondary | ICD-10-CM | POA: Diagnosis present

## 2021-11-15 DIAGNOSIS — E876 Hypokalemia: Secondary | ICD-10-CM | POA: Diagnosis present

## 2021-11-15 DIAGNOSIS — E872 Acidosis, unspecified: Secondary | ICD-10-CM | POA: Diagnosis present

## 2021-11-15 DIAGNOSIS — A419 Sepsis, unspecified organism: Secondary | ICD-10-CM | POA: Diagnosis present

## 2021-11-15 DIAGNOSIS — I10 Essential (primary) hypertension: Secondary | ICD-10-CM | POA: Diagnosis present

## 2021-11-15 DIAGNOSIS — K219 Gastro-esophageal reflux disease without esophagitis: Secondary | ICD-10-CM | POA: Diagnosis present

## 2021-11-15 LAB — COMPREHENSIVE METABOLIC PANEL
ALT: 20 U/L (ref 0–44)
AST: 40 U/L (ref 15–41)
Albumin: 4.5 g/dL (ref 3.5–5.0)
Alkaline Phosphatase: 75 U/L (ref 38–126)
Anion gap: 13 (ref 5–15)
BUN: 7 mg/dL — ABNORMAL LOW (ref 8–23)
CO2: 28 mmol/L (ref 22–32)
Calcium: 9.8 mg/dL (ref 8.9–10.3)
Chloride: 99 mmol/L (ref 98–111)
Creatinine, Ser: 0.87 mg/dL (ref 0.44–1.00)
GFR, Estimated: >60 mL/min (ref >60)
Glucose, Bld: 123 mg/dL — ABNORMAL HIGH (ref 70–99)
Potassium: 2.4 mmol/L — CL (ref 3.5–5.1)
Sodium: 140 mmol/L (ref 135–145)
Total Bilirubin: 0.5 mg/dL (ref 0.3–1.2)
Total Protein: 7.5 g/dL (ref 6.5–8.1)

## 2021-11-15 LAB — LACTIC ACID, PLASMA: Lactic Acid, Venous: 1.4 mmol/L (ref 0.5–1.9)

## 2021-11-15 LAB — BLOOD GAS, VENOUS
Acid-Base Excess: 5.1 mmol/L — ABNORMAL HIGH (ref 0.0–2.0)
Bicarbonate: 28.6 mmol/L — ABNORMAL HIGH (ref 20.0–28.0)
FIO2: 21
O2 Saturation: 78 %
Patient temperature: 37
pCO2, Ven: 38.2 mmHg — ABNORMAL LOW (ref 44.0–60.0)
pH, Ven: 7.487 — ABNORMAL HIGH (ref 7.250–7.430)
pO2, Ven: 41.6 mmHg (ref 32.0–45.0)

## 2021-11-15 LAB — PROCALCITONIN: Procalcitonin: <0.1 ng/mL

## 2021-11-15 LAB — CBC WITH DIFFERENTIAL/PLATELET
Abs Immature Granulocytes: 0.03 10*3/uL (ref 0.00–0.07)
Basophils Absolute: 0 10*3/uL (ref 0.0–0.1)
Basophils Relative: 0 %
Eosinophils Absolute: 0 10*3/uL (ref 0.0–0.5)
Eosinophils Relative: 0 %
HCT: 38.9 % (ref 36.0–46.0)
Hemoglobin: 13.1 g/dL (ref 12.0–15.0)
Immature Granulocytes: 0 %
Lymphocytes Relative: 24 %
Lymphs Abs: 2.7 10*3/uL (ref 0.7–4.0)
MCH: 27.9 pg (ref 26.0–34.0)
MCHC: 33.7 g/dL (ref 30.0–36.0)
MCV: 82.9 fL (ref 80.0–100.0)
Monocytes Absolute: 1 10*3/uL (ref 0.1–1.0)
Monocytes Relative: 9 %
Neutro Abs: 7.3 10*3/uL (ref 1.7–7.7)
Neutrophils Relative %: 67 %
Platelets: 205 10*3/uL (ref 150–400)
RBC: 4.69 MIL/uL (ref 3.87–5.11)
RDW: 13.5 % (ref 11.5–15.5)
WBC: 11 10*3/uL — ABNORMAL HIGH (ref 4.0–10.5)
nRBC: 0 % (ref 0.0–0.2)

## 2021-11-15 LAB — TSH: TSH: 1.692 u[IU]/mL (ref 0.350–4.500)

## 2021-11-15 LAB — CK: Total CK: 868 U/L — ABNORMAL HIGH (ref 38–234)

## 2021-11-15 LAB — HIV ANTIBODY (ROUTINE TESTING W REFLEX): HIV Screen 4th Generation wRfx: NONREACTIVE

## 2021-11-15 LAB — MAGNESIUM: Magnesium: 1.6 mg/dL — ABNORMAL LOW (ref 1.7–2.4)

## 2021-11-15 MED ORDER — PANTOPRAZOLE SODIUM 40 MG IV SOLR
40.0000 mg | Freq: Two times a day (BID) | INTRAVENOUS | Status: DC
  Administered 2021-11-15 – 2021-11-17 (×5): 40 mg via INTRAVENOUS
  Filled 2021-11-15 (×5): qty 40

## 2021-11-15 MED ORDER — NALOXONE HCL 0.4 MG/ML IJ SOLN: 0.4000 mg | INTRAMUSCULAR | Status: DC | PRN

## 2021-11-15 MED ORDER — AMLODIPINE BESYLATE 10 MG PO TABS
10.0000 mg | ORAL_TABLET | Freq: Every day | ORAL | Status: DC
  Administered 2021-11-15 – 2021-11-18 (×4): 10 mg via ORAL
  Filled 2021-11-15 (×4): qty 1

## 2021-11-15 MED ORDER — MAGNESIUM SULFATE 2 GM/50ML IV SOLN
2.0000 g | Freq: Once | INTRAVENOUS | Status: AC
  Administered 2021-11-15: 2 g via INTRAVENOUS
  Filled 2021-11-15: qty 50

## 2021-11-15 MED ORDER — PREDNISONE 20 MG PO TABS: 20.0000 mg | ORAL_TABLET | Freq: Every day | ORAL | Status: DC

## 2021-11-15 MED ORDER — POTASSIUM CHLORIDE CRYS ER 20 MEQ PO TBCR
40.0000 meq | EXTENDED_RELEASE_TABLET | ORAL | Status: AC
  Administered 2021-11-15 (×3): 40 meq via ORAL
  Filled 2021-11-15 (×3): qty 2

## 2021-11-15 MED ORDER — METOPROLOL TARTRATE 25 MG PO TABS
25.0000 mg | ORAL_TABLET | Freq: Two times a day (BID) | ORAL | Status: DC
  Administered 2021-11-15 – 2021-11-18 (×7): 25 mg via ORAL
  Filled 2021-11-15 (×7): qty 1

## 2021-11-15 MED ORDER — POTASSIUM CHLORIDE IN NACL 40-0.9 MEQ/L-% IV SOLN
INTRAVENOUS | Status: DC
  Filled 2021-11-15 (×7): qty 1000

## 2021-11-15 MED ORDER — METRONIDAZOLE 500 MG/100ML IV SOLN
500.0000 mg | Freq: Two times a day (BID) | INTRAVENOUS | Status: DC
  Administered 2021-11-15 (×2): 500 mg via INTRAVENOUS
  Filled 2021-11-15 (×2): qty 100

## 2021-11-15 MED ORDER — SODIUM CHLORIDE 0.9 % IV SOLN: INTRAVENOUS | Status: DC

## 2021-11-15 MED ORDER — BUSPIRONE HCL 5 MG PO TABS
10.0000 mg | ORAL_TABLET | Freq: Three times a day (TID) | ORAL | Status: DC
  Administered 2021-11-15 – 2021-11-18 (×10): 10 mg via ORAL
  Filled 2021-11-15 (×10): qty 2

## 2021-11-15 MED ORDER — ACETAMINOPHEN 650 MG RE SUPP: 650.0000 mg | Freq: Four times a day (QID) | RECTAL | Status: DC | PRN

## 2021-11-15 MED ORDER — PANTOPRAZOLE SODIUM 40 MG PO TBEC
40.0000 mg | DELAYED_RELEASE_TABLET | Freq: Every day | ORAL | Status: DC
  Administered 2021-11-15: 40 mg via ORAL
  Filled 2021-11-15: qty 1

## 2021-11-15 MED ORDER — PANTOPRAZOLE SODIUM 40 MG IV SOLR: 40.0000 mg | Freq: Every day | INTRAVENOUS | Status: DC

## 2021-11-15 MED ORDER — ENOXAPARIN SODIUM 40 MG/0.4ML IJ SOSY
40.0000 mg | PREFILLED_SYRINGE | INTRAMUSCULAR | Status: DC
  Administered 2021-11-15 – 2021-11-18 (×4): 40 mg via SUBCUTANEOUS
  Filled 2021-11-15 (×4): qty 0.4

## 2021-11-15 MED ORDER — PREGABALIN 25 MG PO CAPS
50.0000 mg | ORAL_CAPSULE | Freq: Three times a day (TID) | ORAL | Status: DC
  Administered 2021-11-15 – 2021-11-18 (×10): 50 mg via ORAL
  Filled 2021-11-15 (×10): qty 2

## 2021-11-15 MED ORDER — HYDROXYZINE HCL 25 MG PO TABS: 25.0000 mg | ORAL_TABLET | Freq: Three times a day (TID) | ORAL | Status: DC | PRN

## 2021-11-15 MED ORDER — LACTATED RINGERS IV SOLN
INTRAVENOUS | Status: DC
Start: 2021-11-15 — End: 2021-11-15

## 2021-11-15 MED ORDER — LEVETIRACETAM 500 MG PO TABS
500.0000 mg | ORAL_TABLET | Freq: Two times a day (BID) | ORAL | Status: DC
  Administered 2021-11-15 – 2021-11-18 (×7): 500 mg via ORAL
  Filled 2021-11-15 (×7): qty 1

## 2021-11-15 MED ORDER — QUETIAPINE FUMARATE 50 MG PO TABS
100.0000 mg | ORAL_TABLET | Freq: Once | ORAL | Status: AC
Start: 2021-11-15 — End: 2021-11-15
  Administered 2021-11-15: 100 mg via ORAL
  Filled 2021-11-15: qty 2

## 2021-11-15 MED ORDER — ACETAMINOPHEN 325 MG PO TABS
650.0000 mg | ORAL_TABLET | Freq: Four times a day (QID) | ORAL | Status: DC | PRN
  Administered 2021-11-15 – 2021-11-16 (×3): 650 mg via ORAL
  Filled 2021-11-15 (×3): qty 2

## 2021-11-15 MED ORDER — HYDROMORPHONE HCL 1 MG/ML IJ SOLN
1.0000 mg | INTRAMUSCULAR | Status: DC | PRN
  Administered 2021-11-15 (×2): 1 mg via INTRAVENOUS
  Filled 2021-11-15 (×3): qty 1

## 2021-11-15 MED ORDER — SODIUM CHLORIDE 0.9 % IV SOLN
2.0000 g | INTRAVENOUS | Status: DC
  Administered 2021-11-15: 2 g via INTRAVENOUS
  Filled 2021-11-15: qty 20

## 2021-11-15 MED ORDER — LEVETIRACETAM IN NACL 500 MG/100ML IV SOLN
500.0000 mg | Freq: Two times a day (BID) | INTRAVENOUS | Status: DC
  Filled 2021-11-15: qty 100

## 2021-11-15 MED ORDER — POTASSIUM CHLORIDE 10 MEQ/100ML IV SOLN
10.0000 meq | INTRAVENOUS | Status: AC
  Administered 2021-11-15 (×4): 10 meq via INTRAVENOUS
  Filled 2021-11-15 (×4): qty 100

## 2021-11-15 MED ORDER — HYDROCHLOROTHIAZIDE 25 MG PO TABS
25.0000 mg | ORAL_TABLET | Freq: Every day | ORAL | Status: DC
  Administered 2021-11-15 – 2021-11-18 (×4): 25 mg via ORAL
  Filled 2021-11-15 (×4): qty 1

## 2021-11-15 MED ORDER — MELATONIN 3 MG PO TABS
3.0000 mg | ORAL_TABLET | Freq: Every day | ORAL | Status: DC
  Administered 2021-11-15 – 2021-11-17 (×3): 3 mg via ORAL
  Filled 2021-11-15 (×3): qty 1

## 2021-11-15 MED ORDER — ONDANSETRON HCL 4 MG/2ML IJ SOLN
4.0000 mg | Freq: Four times a day (QID) | INTRAMUSCULAR | Status: DC | PRN
  Administered 2021-11-15 (×2): 4 mg via INTRAVENOUS
  Filled 2021-11-15 (×2): qty 2

## 2021-11-15 NOTE — Progress Notes (Addendum)
PROGRESS NOTE    Yolanda Keith  [id number] DOB: 1950-12-03 DOA: 11/14/2021 PCP: Glendon Axe, MD   Brief Narrative:  Yolanda Keith is a 71 y.o. female with medical history significant for GERD, essential hypertension, seizure disorder, who presented to [place]  complaining of right upper quadrant abdominal pain for 1 to 2 days which was nonradiating, sharp and associated with subjective fever, nausea vomiting but no diarrhea, urinary complaints or chills.  Upon arrival to ED, her Temperature max 100.7; initial heart rate 125, oxygen saturation 99 to 100% on room air, but flasher 137/90. Potassium 3.1, Urinalysis showed no white blood cells, rare bacteria,.  Initial lactate 3.8,  COVID-19/influenza PCR negative blood cultures x2 collected prior to initiation of antibiotics.   Unremarkable chest x-ray, Noncontrast CT that showed no evidence of acute intracranial process, including no evidence of intracranial hemorrhage or any evidence of acute ischemic infarct.  CT abdomen/pelvis showed a 1.8 cm gallstone within the gallbladder itself, but in the absence of any evidence of pericholecystic fluid nor any evidence of gallbladder wall thickening, nor any evidence of common bile duct dilation and no evidence of choledocholithiasis.  Assessment & Plan:   Principal Problem:   Severe sepsis ([place]) Active Problems:   Nausea & vomiting   Right upper quadrant abdominal pain   Hypokalemia   Lactic acidosis   GERD (gastroesophageal reflux disease)   Hypertension  Abdominal pain/fever/nausea vomiting: Patient's pain is improved.  She is most tender at the epigastrium instead of right upper quadrant.  CT as well as ultrasound abdomen rule out acute cholecystitis.  She has now remained afebrile.  Procalcitonin also unremarkable.  All indicators so far are arguing against any sort of infection or sepsis.  However further work-up is in progress so sepsis is not ruled out completely.  Her  low-grade fever and tachycardia as well as lactic acidosis could very well be due to dehydration secondary to nausea and vomiting.  She is well-hydrated however she did have nausea and vomited x1 when I saw her this morning.  I will continue IV hydration.  Her UA was not ideal so I will repeat UA.  Having said that, she has no urinary complaints.  I am going to continue empiric antibiotics however will switch to Rocephin and continue Flagyl.  If she were to develop fever again, I would consider HIDA scan.  I do think that she probably has some gastritis based on my examination, she is on omeprazole 40x1, I will transition her to IV twice daily.  Lactic acidosis has resolved.  Blood culture is negative to date.  History of seizure: Continue Keppra.  Hypertension: Elevated.  Resume amlodipine and hydrochlorothiazide.  Severe hypokalemia: 2.4.  Will replace.  Hypomagnesemia: We will replace.  Bilateral knee pain: Patient is complaining that both of her knees are popping out lately and she is hurting as well.  On my examination, they appear normal.  Nontender with no swelling and no redness.  We will proceed with x-ray of bilateral knee.  PT OT consult.  DVT prophylaxis: SCDs Start: 11/15/21 0028, will start on Lovenox   Code Status: Full Code  Family Communication:  None present at bedside.  Plan of care discussed with patient in length and he/she verbalized understanding and agreed with it.  Status is: Inpatient Remains inpatient appropriate because: Work-up in progress for possible infection.  Planned Discharge Destination: Home with Home Health         Estimated body mass index is  31.55 kg/m as calculated from the following:   Height as of this encounter: 5\' 3"  (1.6 m).   Weight as of this encounter: 80.8 kg.    Nutritional Assessment: Body mass index is 31.55 kg/m.Marland Kitchen Seen by dietician.  I agree with the assessment and plan as outlined below: Nutrition Status:        . Skin  Assessment: I have examined the patient's skin and I agree with the wound assessment as performed by the wound care RN as outlined below:    Consultants:  None  Procedures:  None  Antimicrobials:  Anti-infectives (From admission, onward)    Start     Dose/Rate Route Frequency Ordered Stop   11/15/21 1800  vancomycin (VANCOCIN) IVPB 1000 mg/200 mL premix  Status:  Discontinued        1,000 mg 200 mL/hr over 60 Minutes Intravenous Every 24 hours 11/14/21 1737 11/15/21 1006   11/15/21 1800  cefTRIAXone (ROCEPHIN) 2 g in sodium chloride 0.9 % 100 mL IVPB        2 g 200 mL/hr over 30 Minutes Intravenous Every 24 hours 11/15/21 1006     11/15/21 1100  metroNIDAZOLE (FLAGYL) IVPB 500 mg        500 mg 100 mL/hr over 60 Minutes Intravenous Every 12 hours 11/15/21 1006     11/15/21 0200  aztreonam (AZACTAM) 2 g in sodium chloride 0.9 % 100 mL IVPB  Status:  Discontinued        2 g 200 mL/hr over 30 Minutes Intravenous Every 8 hours 11/14/21 1734 11/15/21 1006   11/14/21 1730  aztreonam (AZACTAM) 2 g in sodium chloride 0.9 % 100 mL IVPB        2 g 200 mL/hr over 30 Minutes Intravenous  Once 11/14/21 1715 11/14/21 2050   11/14/21 1730  metroNIDAZOLE (FLAGYL) IVPB 500 mg        500 mg 100 mL/hr over 60 Minutes Intravenous  Once 11/14/21 1715 11/14/21 2050   11/14/21 1730  vancomycin (VANCOCIN) IVPB 1000 mg/200 mL premix        1,000 mg 200 mL/hr over 60 Minutes Intravenous  Once 11/14/21 1715 11/14/21 2154         Subjective: Patient seen and examined.  She stated that she is not feeling any better than yesterday.  She did not sleep well.  She was requesting a sleeping pill in the morning.  She was also having nausea and did vomit when I was there.  Her major complaint was pain around the sacral bone.  She did not mention abdominal pain until asked.  She was also concerned about bilateral knee popping out and pain.  Objective: Vitals:   11/14/21 2356 11/15/21 0449 11/15/21 0619  11/15/21 0755  BP: (!) 167/108  (!) 134/100 (!) 139/115  Pulse: (!) 120  91 95  Resp:    17  Temp: 99 F (37.2 C)  98.4 F (36.9 C) 98.4 F (36.9 C)  TempSrc: Oral   Oral  SpO2: 100%  99% 99%  Weight:  80.8 kg    Height:        Intake/Output Summary (Last 24 hours) at 11/15/2021 1217 Last data filed at 11/15/2021 0824 Gross per 24 hour  Intake 2465.41 ml  Output 200 ml  Net 2265.41 ml   Filed Weights   11/14/21 1957 11/15/21 0449  Weight: 79.4 kg 80.8 kg    Examination:  General exam: Appears weak and sick. Respiratory system: Clear to auscultation. Respiratory effort  normal. Cardiovascular system: S1 & S2 heard, RRR. No JVD, murmurs, rubs, gallops or clicks. No pedal edema. Gastrointestinal system: Abdomen is nondistended, soft and moderate epigastric and mild right upper quadrant tenderness.. No organomegaly or masses felt. Normal bowel sounds heard. Central nervous system: Alert and oriented. No focal neurological deficits. Extremities: Symmetric 5 x 5 power. Skin: No rashes, lesions or ulcers Psychiatry: Judgement and insight appear normal.    Data Reviewed: I have personally reviewed following labs and imaging studies  CBC: Recent Labs  Lab 11/14/21 1418 11/15/21 0052  WBC 10.0 11.0*  NEUTROABS  --  7.3  HGB 14.5 13.1  HCT 44.1 38.9  MCV 83.8 82.9  PLT 250 [id number]   Basic Metabolic Panel: Recent Labs  Lab 11/14/21 1418 11/15/21 0052  NA 141 140  K 3.1* 2.4*  CL 96* 99  CO2 30 28  GLUCOSE 137* 123*  BUN 12 7*  CREATININE 0.93 0.87  CALCIUM 10.9* 9.8  MG  --  1.6*   GFR: Estimated Creatinine Clearance: 61.5 mL/min (by C-G formula based on SCr of 0.87 mg/dL). Liver Function Tests: Recent Labs  Lab 11/14/21 1418 11/15/21 0052  AST 36 40  ALT 21 20  ALKPHOS 88 75  BILITOT 0.6 0.5  PROT 8.6* 7.5  ALBUMIN 5.3* 4.5   Recent Labs  Lab 11/14/21 1418  LIPASE 27   Recent Labs  Lab 11/14/21 1621  AMMONIA 25   Coagulation Profile: Recent  Labs  Lab 11/14/21 2053  INR 0.9   Cardiac Enzymes: Recent Labs  Lab 11/15/21 0052  CKTOTAL 868*   BNP (last 3 results) No results for input(s): PROBNP in the last 8760 hours. HbA1C: No results for input(s): HGBA1C in the last 72 hours. CBG: Recent Labs  Lab 11/14/21 1613 11/14/21 1739  GLUCAP 167* 116*   Lipid Profile: No results for input(s): CHOL, HDL, LDLCALC, TRIG, CHOLHDL, LDLDIRECT in the last 72 hours. Thyroid Function Tests: Recent Labs    11/15/21 0052  TSH 1.692   Anemia Panel: No results for input(s): VITAMINB12, FOLATE, FERRITIN, TIBC, IRON, RETICCTPCT in the last 72 hours. Sepsis Labs: Recent Labs  Lab 11/14/21 1621 11/14/21 2051 11/15/21 0052  PROCALCITON  --   --  <0.10  LATICACIDVEN 3.8* 1.5 1.4    Recent Results (from the past 240 hour(s))  Resp Panel by RT-PCR (Flu A&B, Covid) Nasopharyngeal Swab     Status: None   Collection Time: 11/14/21  4:00 PM   Specimen: Nasopharyngeal Swab; Nasopharyngeal(NP) swabs in vial transport medium  Result Value Ref Range Status   SARS Coronavirus 2 by RT PCR NEGATIVE NEGATIVE Final    Comment: (NOTE) SARS-CoV-2 target nucleic acids are NOT DETECTED.  The SARS-CoV-2 RNA is generally detectable in upper respiratory specimens during the acute phase of infection. The lowest concentration of SARS-CoV-2 viral copies this assay can detect is 138 copies/mL. A negative result does not preclude SARS-Cov-2 infection and should not be used as the sole basis for treatment or other patient management decisions. A negative result may occur with  improper specimen collection/handling, submission of specimen other than nasopharyngeal swab, presence of viral mutation(s) within the areas targeted by this assay, and inadequate number of viral copies(<138 copies/mL). A negative result must be combined with clinical observations, patient history, and epidemiological information. The expected result is Negative.  Fact Sheet  for Patients:  EntrepreneurPulse.com.au  Fact Sheet for Healthcare Providers:  IncredibleEmployment.be  This test is no t yet approved or cleared by the  [place] Architectural technologist and  has been authorized for detection and/or diagnosis of SARS-CoV-2 by FDA under an Print production planner (EUA). This EUA will remain  in effect (meaning this test can be used) for the duration of the COVID-19 declaration under Section 564(b)(1) of the Act, 21 U.S.C.section 360bbb-3(b)(1), unless the authorization is terminated  or revoked sooner.       Influenza A by PCR NEGATIVE NEGATIVE Final   Influenza B by PCR NEGATIVE NEGATIVE Final    Comment: (NOTE) The Xpert Xpress SARS-CoV-2/FLU/RSV plus assay is intended as an aid in the diagnosis of influenza from Nasopharyngeal swab specimens and should not be used as a sole basis for treatment. Nasal washings and aspirates are unacceptable for Xpert Xpress SARS-CoV-2/FLU/RSV testing.  Fact Sheet for Patients: EntrepreneurPulse.com.au  Fact Sheet for Healthcare Providers: IncredibleEmployment.be  This test is not yet approved or cleared by the [place] FDA and has been authorized for detection and/or diagnosis of SARS-CoV-2 by FDA under an Emergency Use Authorization (EUA). This EUA will remain in effect (meaning this test can be used) for the duration of the COVID-19 declaration under Section 564(b)(1) of the Act, 21 U.S.C. section 360bbb-3(b)(1), unless the authorization is terminated or revoked.  Performed at [facility], [address] [place], [place] [address]   MRSA Next Gen by PCR, Nasal     Status: None   Collection Time: 11/14/21  8:22 PM   Specimen: Nasal Mucosa; Nasal Swab  Result Value Ref Range Status   MRSA by PCR Next Gen NOT DETECTED NOT DETECTED Final    Comment: (NOTE) The GeneXpert MRSA Assay (FDA approved for NASAL specimens only), is one  component of a comprehensive MRSA colonization surveillance program. It is not intended to diagnose MRSA infection nor to guide or monitor treatment for MRSA infections. Test performance is not FDA approved in patients less than 85 years old. Performed at [facility], [place] [address], [place], [place] [address]      Radiology Studies: CT Head Wo Contrast  Result Date: 11/14/2021 CLINICAL DATA:  Acute neuro deficit. Stroke suspected. Vomiting and chills starting last night. Seen in ER for recent fall 11/12/2021. EXAM: CT HEAD WITHOUT CONTRAST TECHNIQUE: Contiguous axial images were obtained from the base of the skull through the vertex without intravenous contrast. RADIATION DOSE REDUCTION: This exam was performed according to the departmental dose-optimization program which includes automated exposure control, adjustment of the mA and/or kV according to patient size and/or use of iterative reconstruction technique. COMPARISON:  None. FINDINGS: Brain: The ventricles are normal in size and configuration. The basilar cisterns are patent. No mass, mass effect, or midline shift. No acute intracranial hemorrhage is seen. No abnormal extra-axial fluid collection. Preservation of the normal cortical gray-white interface without CT evidence of an acute major vascular territorial cortical based infarction. Vascular: No hyperdense vessel or unexpected calcification. Skull: Normal. Negative for fracture or focal lesion. Sinuses/Orbits: The visualized orbits are unremarkable. The visualized paranasal sinuses and mastoid air cells are clear. Other: None. IMPRESSION: No acute intracranial process. Electronically Signed   By: Yvonne Kendall M.D.   On: 11/14/2021 17:18   CT ABDOMEN PELVIS W CONTRAST  Result Date: 11/14/2021 CLINICAL DATA:  Right lower quadrant abdominal pain. Vomiting and chills. EXAM: CT ABDOMEN AND PELVIS WITH CONTRAST TECHNIQUE: Multidetector CT imaging of the abdomen and pelvis was performed  using the standard protocol following bolus administration of intravenous contrast. RADIATION DOSE REDUCTION: This exam was performed according  to the departmental dose-optimization program which includes automated exposure control, adjustment of the mA and/or kV according to patient size and/or use of iterative reconstruction technique. CONTRAST:  100mL OMNIPAQUE IOHEXOL 300 MG/ML  SOLN COMPARISON:  None. FINDINGS: Lower chest: Small type 1 hiatal hernia. Hepatobiliary: Faint rim calcified gallstone measuring 1.8 cm noted dependently in the gallbladder. No biliary dilatation. Mild focal steatosis in segment 4 of the liver adjacent to the falciform ligament. Pancreas: Unremarkable Spleen: Unremarkable Adrenals/Urinary Tract: 6 by 9 mm hypodense lesion medially along the left kidney upper pole is likely a cyst although technically too small to characterize. Adrenal glands unremarkable. Contrast medium in the collecting systems on the initial images, presumably due to some kind of test injection, which reduces sensitivity for nonobstructive calculi. No hydronephrosis or hydroureter. Nondistended urinary bladder. Stomach/Bowel: Scattered sigmoid colon diverticula. Appendix surgically absent. Vascular/Lymphatic: Atherosclerosis is present, including aortoiliac atherosclerotic disease. Reproductive: Uterus absent.  Adnexa unremarkable. Other: No supplemental non-categorized findings. Musculoskeletal: Lumbar spondylosis and degenerative disc disease causing substantial bilateral foraminal impingement L3-4, L4-5, and L5-S1. The previous radiographic findings suspicious for sacral fracture are less convincing on today's CT examination. IMPRESSION: 1. A cause for the patient's right lower quadrant abdominal pain is not identified. There is no dilated bowel. The appendix is surgically absent. 2. 1.8 gallstone in the gallbladder noted but without pericholecystic fluid or gallbladder wall thickening. 3. There are findings  suspicious for sacral fracture on the recent conventional radiographs, although the appearance is less suspicious on today's CT. 4. Atherosclerosis is present, including aortoiliac atherosclerotic disease. 5. Substantial foraminal impingement at L3-4, L4-5, and L5-S1. 6. Small type 1 hiatal hernia. Electronically Signed   By: Gaylyn RongWalter  Liebkemann M.D.   On: 11/14/2021 17:23   DG Chest Port 1 View  Result Date: 11/14/2021 CLINICAL DATA:  Questionable sepsis. EXAM: PORTABLE CHEST 1 VIEW COMPARISON:  None. FINDINGS: The heart size and mediastinal contours are within normal limits. Both lungs are clear. The visualized skeletal structures are unremarkable. IMPRESSION: No active disease. Electronically Signed   By: Darliss CheneyAmy  Guttmann M.D.   On: 11/14/2021 17:41   US Abdomen Limited RUQ (LIVER/GB)  Result Date: 11/14/2021 CLINICAL DATA:  Abdominal pain EXAM: ULTRASOUND ABDOMEN LIMITED RIGHT UPPER QUADRANT COMPARISON:  11/14/2021 CT abdomen pelvis FINDINGS: Gallbladder: Large peripherally calcified stone within the gallbladder, which measures up to 2.1 cm. No wall thickening. No pericholecystic fluid. No sonographic Murphy sign noted by sonographer. Common bile duct: Diameter: 5 mm Liver: No focal lesion identified. Increased parenchymal echogenicity. Portal vein is patent on color Doppler imaging with normal direction of blood flow towards the liver. Other: None. IMPRESSION: 1. Cholelithiasis without evidence of cholecystitis. 2. Hepatic steatosis. Electronically Signed   By: Wiliam KeAlison  Vasan M.D.   On: 11/14/2021 19:35    Scheduled Meds:  amLODipine  10 mg Oral Daily   busPIRone  10 mg Oral TID   hydrochlorothiazide  25 mg Oral Daily   levETIRAcetam  500 mg Oral BID   metoprolol tartrate  25 mg Oral BID   pantoprazole (PROTONIX) IV  40 mg Intravenous Q12H   potassium chloride  40 mEq Oral Q4H   pregabalin  50 mg Oral TID   Continuous Infusions:  0.9 % NaCl with KCl 40 mEq / L     cefTRIAXone (ROCEPHIN)  IV      metronidazole 500 mg (11/15/21 1157)     LOS: 1 day   Time spent: 35 minutes  Hughie Clossavi Evelen Vazguez, MD [facility]  11/15/2021, 12:17  PM  Please page via Amion and do not message via secure chat for urgent patient care matters. Secure chat can be used for non urgent patient care matters.  How to contact the [facility] Attending or Consulting provider [place] or covering provider during after hours [place], for this patient?  Check the care team in [facility] and look for a) attending/consulting [facility] provider listed and b) the [facility] team listed. Page or secure chat [phone]. Log into [email or web address] and use [place]'s universal password to access. If you do not have the password, please contact the hospital operator. Locate the [facility] provider you are looking for under [facility] and page to a number that you can be directly reached. If you still have difficulty reaching the provider, please page the [facility] (Director on Call) for the Hospitalists listed on amion for assistance.

## 2021-11-15 NOTE — H&P (Signed)
History and Physical    PLEASE NOTE THAT DRAGON DICTATION SOFTWARE WAS USED IN THE CONSTRUCTION OF THIS NOTE.   Yolanda Keith RCV:[id number] DOB: [birth year] DOA: 11/14/2021  PCP: Glendon Axe, MD  Patient coming from: home   I have personally briefly reviewed patient's old medical records in [place]  Chief Complaint: Right upper quadrant abdominal pain  HPI: Yolanda Keith is a 71 y.o. female with medical history significant for GERD, essential hypertension, seizure disorder, who is admitted to [facility] on 11/14/2021 by way of transfer from [place] with severe sepsis of unclear source after presenting from home to the latter facility complaining of right upper quadrant abdominal pain.  Patient reports 1 to 2 days of progressive right upper quadrant abdominal pain, which was initially intermittent, but is increased in frequency to not being constant in nature.  She describes it as sharp, nonradiating.  Worse with palpation as well as worse with attempts to eat or drink anything over that timeframe.  She also notes subjective fever in the absence of any chills, full body rigors, or generalized myalgias.  No diarrhea, melena, or hematochezia.  Denies any associated headache, neck stiffness, or rash.  No preceding trauma or travel.  Denies any associated dysuria, gross hematuria, change in urinary urgency/frequency.  She also denies any associated chest pain, shortness of breath, palpitations, diaphoresis.  She does however report intermittent nausea over the last 1 to 2 days, resulting in 2-3 episodes of nonbloody, nonbilious emesis over that timeframe, with most recent such episode occurring just prior to presenting to [place] emergency department today.  Denies any associated neck stiffness, rhinitis, rhinorrhea, sore throat, or cough.  There is also some concern that the patient is exhibiting some evidence of confusion.     [facility] ED  Course:  Vital signs in the ED were notable for the following: Temperature max 100.7; initial heart rate 125, which decreased to 107 following interval administration of IV fluids, as further quantified below; respiratory rate 69, oxygen saturation 99 to 100% on room air, but flasher 137/90.  Labs were notable for the following: CMP notable for the following: Potassium 3.1, creatinine 0.93, glucose 137, AST 36, ALT 21, alk phosphatase 88, total bilirubin 0.6.  CBC notable for blood cell count 10,000.  Urinalysis showed no white blood cells, rare bacteria,.  Initial lactate 3.8, with repeat value trending down to 1.5 following interval administration of IV fluids, as further quantified below.  INR 0.9.  COVID-19/influenza PCR negative blood cultures x2 collected prior to initiation of antibiotics.  Imaging and additional notable ED work-up: Chest x-ray showed no evidence of acute cardiopulmonary process.  Noncontrast CT that showed no evidence of acute intracranial process, including no evidence of intracranial hemorrhage or any evidence of acute ischemic infarct.  CT abdomen/pelvis showed a 1.8 cm gallstone within the gallbladder itself, but in the absence of any evidence of pericholecystic fluid nor any evidence of gallbladder wall thickening, nor any evidence of common bile duct dilation and no evidence of choledocholithiasis.  While in the ED, the following were administered: Fentanyl 50 mcg IV x3 doses, Zofran 4 mg IV x1, IV vancomycin, aztreonam, normal saline x3.5 L bolus.  Subsequently, the patient was transferred to [facility] for further evaluation and management of suspected severe sepsis due to unclear infectious source.     Review of Systems: As per HPI otherwise 10 point review of systems negative.   Past Medical  History:  Diagnosis Date   Arthritis    GERD (gastroesophageal reflux disease)    Hyperlipidemia    Hypertension     Past Surgical History:  Procedure Laterality Date    APPENDECTOMY     COLON SURGERY     JOINT REPLACEMENT      Social History:  reports that she has been smoking cigarettes. She has never used smokeless tobacco. She reports current alcohol use. She reports current drug use. Drug: Marijuana.   Allergies  Allergen Reactions   Penicillins Hives   Latex Rash   Sulfa Antibiotics    Zinc     Shakes, restlessness    History reviewed. No pertinent family history.  Family history reviewed and not pertinent    Prior to Admission medications   Medication Sig Start Date End Date Taking? Authorizing Provider  amLODipine (NORVASC) 10 MG tablet Take 10 mg by mouth daily.    [provider]  aspirin 81 MG chewable tablet Chew by mouth daily.    [provider]  busPIRone (BUSPAR) 10 MG tablet Take 10 mg by mouth 3 (three) times daily.    [provider]  hydrochlorothiazide (HYDRODIURIL) 25 MG tablet Take 25 mg by mouth daily.    [provider]  HYDROcodone-acetaminophen (NORCO/VICODIN) 5-325 MG tablet Take 1 tablet by mouth every 6 (six) hours as needed for severe pain. 05/23/21   Blanchie Dessert, MD  hydrOXYzine (ATARAX/VISTARIL) 25 MG tablet Take 25 mg by mouth 3 (three) times daily as needed.    [provider]  levETIRAcetam (KEPPRA) 500 MG tablet Take 500 mg by mouth 2 (two) times daily.    [provider]  metoprolol tartrate (LOPRESSOR) 25 MG tablet Take 25 mg by mouth 2 (two) times daily.    [provider]  omeprazole (PRILOSEC) 40 MG capsule Take 40 mg by mouth daily.    [provider]  potassium chloride (KLOR-CON) 20 MEQ packet Take by mouth 2 (two) times daily.    [provider]  predniSONE (DELTASONE) 20 MG tablet Take 1 tablet (20 mg total) by mouth daily. 05/23/21   Blanchie Dessert, MD  predniSONE (STERAPRED UNI-PAK 21 TAB) 10 MG (21) TBPK tablet Take 6 tabs by mouth daily  for 2 days, then 5 tabs for 2 days, then 4 tabs for 2 days, then 3 tabs for  2 days, 2 tabs for 2 days, then 1 tab by mouth daily for 2 days 05/13/21   Providence Lanius A, PA-C  pregabalin (LYRICA) 50 MG capsule Take 50 mg by mouth 3 (three) times daily.    [provider]     Objective    Physical Exam: Vitals:   11/14/21 2118 11/14/21 2356 11/15/21 0449 11/15/21 0619  BP: (!) 152/93 (!) 167/108  (!) 134/100  Pulse: (!) 107 (!) 120  91  Resp: 16     Temp:  99 F (37.2 C)  98.4 F (36.9 C)  TempSrc:  Oral    SpO2: 98% 100%  99%  Weight:   80.8 kg   Height:        General: appears to be stated age; alert, oriented Skin: warm, dry, no rash Head:  AT/[place] Mouth:  Oral mucosa membranes appear dry, normal dentition Neck: supple; trachea midline Heart:  RRR; did not appreciate any M/R/G Lungs: CTAB, did not appreciate any wheezes, rales, or rhonchi Abdomen: + BS; soft, ND, mild tenderness to palpation over the right upper quadrant in the absence of any  associated guarding, rigidity, or rebound tenderness. Vascular: 2+ pedal pulses b/l; 2+ radial pulses b/l Extremities: no peripheral edema, no muscle wasting Neuro: strength and sensation intact in upper and lower extremities b/l    Labs on Admission: I have personally reviewed following labs and imaging studies  CBC: Recent Labs  Lab 11/14/21 1418 11/15/21 0052  WBC 10.0 11.0*  NEUTROABS  --  7.3  HGB 14.5 13.1  HCT 44.1 38.9  MCV 83.8 82.9  PLT 250 250   Basic Metabolic Panel: Recent Labs  Lab 11/14/21 1418 11/15/21 0052  NA 141 140  K 3.1* 2.4*  CL 96* 99  CO2 30 28  GLUCOSE 137* 123*  BUN 12 7*  CREATININE 0.93 0.87  CALCIUM 10.9* 9.8  MG  --  1.6*   GFR: Estimated Creatinine Clearance: 61.5 mL/min (by C-G formula based on SCr of 0.87 mg/dL). Liver Function Tests: Recent Labs  Lab 11/14/21 1418 11/15/21 0052  AST 36 40  ALT 21 20  ALKPHOS 88 75  BILITOT 0.6 0.5  PROT 8.6* 7.5  ALBUMIN 5.3* 4.5   Recent Labs  Lab 11/14/21 1418  LIPASE 27   Recent Labs  Lab  11/14/21 1621  AMMONIA 25   Coagulation Profile: Recent Labs  Lab 11/14/21 2053  INR 0.9   Cardiac Enzymes: No results for input(s): CKTOTAL, CKMB, CKMBINDEX, TROPONINI in the last 168 hours. BNP (last 3 results) No results for input(s): PROBNP in the last 8760 hours. HbA1C: No results for input(s): HGBA1C in the last 72 hours. CBG: Recent Labs  Lab 11/14/21 1613 11/14/21 1739  GLUCAP 167* 116*   Lipid Profile: No results for input(s): CHOL, HDL, LDLCALC, TRIG, CHOLHDL, LDLDIRECT in the last 72 hours. Thyroid Function Tests: No results for input(s): TSH, T4TOTAL, FREET4, T3FREE, THYROIDAB in the last 72 hours. Anemia Panel: No results for input(s): VITAMINB12, FOLATE, FERRITIN, TIBC, IRON, RETICCTPCT in the last 72 hours. Urine analysis:    Component Value Date/Time   COLORURINE YELLOW 11/14/2021 1621   APPEARANCEUR HAZY (A) 11/14/2021 1621   LABSPEC 1.020 11/14/2021 1621   PHURINE 8.5 (H) 11/14/2021 1621   GLUCOSEU NEGATIVE 11/14/2021 1621   HGBUR TRACE (A) 11/14/2021 1621   BILIRUBINUR NEGATIVE 11/14/2021 1621   KETONESUR NEGATIVE 11/14/2021 1621   PROTEINUR 100 (A) 11/14/2021 1621   NITRITE NEGATIVE 11/14/2021 1621   LEUKOCYTESUR NEGATIVE 11/14/2021 1621    Radiological Exams on Admission: CT Head Wo Contrast  Result Date: 11/14/2021 CLINICAL DATA:  Acute neuro deficit. Stroke suspected. Vomiting and chills starting last night. Seen in ER for recent fall 11/12/2021. EXAM: CT HEAD WITHOUT CONTRAST TECHNIQUE: Contiguous axial images were obtained from the base of the skull through the vertex without intravenous contrast. RADIATION DOSE REDUCTION: This exam was performed according to the departmental dose-optimization program which includes automated exposure control, adjustment of the mA and/or kV according to patient size and/or use of iterative reconstruction technique. COMPARISON:  None. FINDINGS: Brain: The ventricles are normal in size and configuration. The  basilar cisterns are patent. No mass, mass effect, or midline shift. No acute intracranial hemorrhage is seen. No abnormal extra-axial fluid collection. Preservation of the normal cortical gray-white interface without CT evidence of an acute major vascular territorial cortical based infarction. Vascular: No hyperdense vessel or unexpected calcification. Skull: Normal. Negative for fracture or focal lesion. Sinuses/Orbits: The visualized orbits are unremarkable. The visualized paranasal sinuses and mastoid air cells are clear. Other: None. IMPRESSION: No acute intracranial process. Electronically Signed  By: Yvonne Kendall M.D.   On: 11/14/2021 17:18   CT ABDOMEN PELVIS W CONTRAST  Result Date: 11/14/2021 CLINICAL DATA:  Right lower quadrant abdominal pain. Vomiting and chills. EXAM: CT ABDOMEN AND PELVIS WITH CONTRAST TECHNIQUE: Multidetector CT imaging of the abdomen and pelvis was performed using the standard protocol following bolus administration of intravenous contrast. RADIATION DOSE REDUCTION: This exam was performed according to the departmental dose-optimization program which includes automated exposure control, adjustment of the mA and/or kV according to patient size and/or use of iterative reconstruction technique. CONTRAST:  169m OMNIPAQUE IOHEXOL 300 MG/ML  SOLN COMPARISON:  None. FINDINGS: Lower chest: Small type 1 hiatal hernia. Hepatobiliary: Faint rim calcified gallstone measuring 1.8 cm noted dependently in the gallbladder. No biliary dilatation. Mild focal steatosis in segment 4 of the liver adjacent to the falciform ligament. Pancreas: Unremarkable Spleen: Unremarkable Adrenals/Urinary Tract: 6 by 9 mm hypodense lesion medially along the left kidney upper pole is likely a cyst although technically too small to characterize. Adrenal glands unremarkable. Contrast medium in the collecting systems on the initial images, presumably due to some kind of test injection, which reduces sensitivity for  nonobstructive calculi. No hydronephrosis or hydroureter. Nondistended urinary bladder. Stomach/Bowel: Scattered sigmoid colon diverticula. Appendix surgically absent. Vascular/Lymphatic: Atherosclerosis is present, including aortoiliac atherosclerotic disease. Reproductive: Uterus absent.  Adnexa unremarkable. Other: No supplemental non-categorized findings. Musculoskeletal: Lumbar spondylosis and degenerative disc disease causing substantial bilateral foraminal impingement L3-4, L4-5, and L5-S1. The previous radiographic findings suspicious for sacral fracture are less convincing on today's CT examination. IMPRESSION: 1. A cause for the patient's right lower quadrant abdominal pain is not identified. There is no dilated bowel. The appendix is surgically absent. 2. 1.8 gallstone in the gallbladder noted but without pericholecystic fluid or gallbladder wall thickening. 3. There are findings suspicious for sacral fracture on the recent conventional radiographs, although the appearance is less suspicious on today's CT. 4. Atherosclerosis is present, including aortoiliac atherosclerotic disease. 5. Substantial foraminal impingement at L3-4, L4-5, and L5-S1. 6. Small type 1 hiatal hernia. Electronically Signed   By: WVan ClinesM.D.   On: 11/14/2021 17:23   DG Chest Port 1 View  Result Date: 11/14/2021 CLINICAL DATA:  Questionable sepsis. EXAM: PORTABLE CHEST 1 VIEW COMPARISON:  None. FINDINGS: The heart size and mediastinal contours are within normal limits. Both lungs are clear. The visualized skeletal structures are unremarkable. IMPRESSION: No active disease. Electronically Signed   By: ARonney AstersM.D.   On: 11/14/2021 17:41   UKoreaAbdomen Limited RUQ (LIVER/GB)  Result Date: 11/14/2021 CLINICAL DATA:  Abdominal pain EXAM: ULTRASOUND ABDOMEN LIMITED RIGHT UPPER QUADRANT COMPARISON:  11/14/2021 CT abdomen pelvis FINDINGS: Gallbladder: Large peripherally calcified stone within the gallbladder, which  measures up to 2.1 cm. No wall thickening. No pericholecystic fluid. No sonographic Murphy sign noted by sonographer. Common bile duct: Diameter: 5 mm Liver: No focal lesion identified. Increased parenchymal echogenicity. Portal vein is patent on color Doppler imaging with normal direction of blood flow towards the liver. Other: None. IMPRESSION: 1. Cholelithiasis without evidence of cholecystitis. 2. Hepatic steatosis. Electronically Signed   By: AMerilyn BabaM.D.   On: 11/14/2021 19:35     EKG: Independently reviewed, with result as described above.    Assessment/Plan   Principal Problem:   Severe sepsis (HCC) Active Problems:   Nausea & vomiting   Right upper quadrant abdominal pain   Hypokalemia   Lactic acidosis   GERD (gastroesophageal reflux disease)   Hypertension      #)  Severe sepsis: Diagnosis on the basis of presenting objective fever, tachycardia, with suspected underlying infection, although source of such currently unclear.  Requiring further evaluation for potential intra-abdominal infectious source given the patient's presenting right upper quadrant abdominal discomfort, with CT abdomen/pelvis showing evidence of a large, 1.8 cm gallstone within the gallbladder itself, without overt radiographic evidence to suggest acute cholecystitis.  Right quadrant ultrasound to further evaluate is currently pending.  No evidence of additional overt infectious source at this time, including urinalysis that was not consistent with UTI, while chest x-ray showed no evidence of acute cardiopulmonary process, and COVID-19/influenza PCR were found to be negative.  Criteria for patient's sepsis to get sed rate severe in nature met on the basis of elevated initial lactate of 3.8, which is socially trended down to 1.5.  No evidence of hypotension.  Greater than 30 mL/kg IV fluid bolus initiated at [facility] prior to transfer.  On IV vancomycin and aztreonam, which were started after  collection of blood cultures x2.  Plan: Follow for results of blood cultures x2.  Continue IV vancomycin and and aztreonam for now.  Repeat CBC differential in the morning.  Follow for result of right upper quadrant ultrasound.  Pending pursuit/results of right upper quadrant ultrasound, will keep n.p.o. for now.  As needed Dilaudid for residual abdominal discomfort.  Prn IV Zofran.      #) Hypokalemia: Initial potassium mildly low at 3.1, in the context of increased GI losses in the form of nausea/vomiting over the course the last 1 to 2 days.  Plan: Potassium chloride 40 mEq IV over 4 hours x 1 dose now.  Add on serum magnesium level.  Repeat CMP in the morning.  Monitor on symmetry.        #) GERD: documented h/o such; on omeprazole as outpatient.   Plan: In the setting of current n.p.o. status, will convert to IV Protonix for now.         #) Essential Hypertension: documented h/o such, with outpatient antihypertensive regimen including Norvasc, HCTZ, Lopressor.  SBP's in the ED today: Normotensive.  However, in the setting of presenting severe sepsis, will hold home antihypertensive regimen for now.  Plan: Close monitoring of subsequent BP via routine VS. hold home blood pressure medications for now, as above.       #) Chronic tobacco abuse: Patient conveys that they are a current smoker.   Plan: Counseled the patient on the importance of complete smoking discontinuation.        #) Seizure disorder: Documented history of such, on Keppra as an outpatient.  No evidence of active seizures at this time.  Plan: In the setting of current n.p.o. status, will temporarily convert home oral Keppra to IV form of administration, pending the above intra-abdominal work-up.       DVT prophylaxis: SCD's   Code Status: Full code Family Communication: none Disposition Plan: Per Rounding Team Consults called: none;  Admission status: Inpatient; med  telemetry     PLEASE NOTE THAT DRAGON DICTATION SOFTWARE WAS USED IN THE CONSTRUCTION OF THIS NOTE.   [place] DO [facility]  From [place]   11/15/2021, 7:00 AM

## 2021-11-15 NOTE — Progress Notes (Addendum)
Mobility Specialist Progress Note:   11/15/21 1245  Mobility  Activity Ambulated with assistance in room  Level of Assistance Minimal assist, patient does 75% or more  Assistive Device Front wheel walker;Cane  Distance Ambulated (ft) 30 ft  Activity Response Tolerated fair  $Mobility charge 1 Mobility   Pt uses SPC at baseline, attempted to use this session, however pt unsteady. RW helped pt with steadiness, however pt with 1 LOB d/t knee buckling, requiring minA to correct. Pt with increased R knee pain upon ambulation stating that "it popped out, it needs to pop back in". Distance limited secondary to pain. Pt left in bed with xray present.   Addison Lank Mobility Specialist  Phone [phone]

## 2021-11-15 NOTE — Progress Notes (Signed)
[place] for Lovenox Indication: VTE prophylaxis  Labs: Recent Labs    11/14/21 1418 11/14/21 2053 11/15/21 0052  HGB 14.5  --  13.1  HCT 44.1  --  38.9  PLT 250  --  205  APTT  --  33  --   LABPROT  --  12.6  --   INR  --  0.9  --   CREATININE 0.93  --  0.87  CKTOTAL  --   --  868*    Assessment: 82 yof presenting with abd pain/fever/nausea/vomiting. Pharmacy consulted to dose Lovenox for VTE prophylaxis. Patient is not on anticoagulation PTA. Previously on SCDs only this admit. CBC and SCr WNL. No bleed issues reported.  Goal of Therapy:  VTE prophylaxis Monitor platelets by anticoagulation protocol: Yes   Plan:  Lovenox 40mg  [place] q24h Monitor CBC, SCr, s/sx bleeding Pharmacy will sign off consult and monitor peripherally   Arturo Morton, PharmD, BCPS Clinical Pharmacist 11/15/2021 12:20 PM

## 2021-11-16 LAB — COMPREHENSIVE METABOLIC PANEL
ALT: 22 U/L (ref 0–44)
AST: 49 U/L — ABNORMAL HIGH (ref 15–41)
Albumin: 4 g/dL (ref 3.5–5.0)
Alkaline Phosphatase: 69 U/L (ref 38–126)
Anion gap: 10 (ref 5–15)
BUN: 9 mg/dL (ref 8–23)
CO2: 23 mmol/L (ref 22–32)
Calcium: 9.8 mg/dL (ref 8.9–10.3)
Chloride: 108 mmol/L (ref 98–111)
Creatinine, Ser: 0.91 mg/dL (ref 0.44–1.00)
GFR, Estimated: >60 mL/min (ref >60)
Glucose, Bld: 97 mg/dL (ref 70–99)
Potassium: 4 mmol/L (ref 3.5–5.1)
Sodium: 141 mmol/L (ref 135–145)
Total Bilirubin: 0.8 mg/dL (ref 0.3–1.2)
Total Protein: 6.4 g/dL — ABNORMAL LOW (ref 6.5–8.1)

## 2021-11-16 LAB — CBC WITH DIFFERENTIAL/PLATELET
Abs Immature Granulocytes: 0.02 10*3/uL (ref 0.00–0.07)
Basophils Absolute: 0 10*3/uL (ref 0.0–0.1)
Basophils Relative: 0 %
Eosinophils Absolute: 0 10*3/uL (ref 0.0–0.5)
Eosinophils Relative: 1 %
HCT: 40.2 % (ref 36.0–46.0)
Hemoglobin: 13.3 g/dL (ref 12.0–15.0)
Immature Granulocytes: 0 %
Lymphocytes Relative: 36 %
Lymphs Abs: 2.9 10*3/uL (ref 0.7–4.0)
MCH: 28 pg (ref 26.0–34.0)
MCHC: 33.1 g/dL (ref 30.0–36.0)
MCV: 84.6 fL (ref 80.0–100.0)
Monocytes Absolute: 0.6 10*3/uL (ref 0.1–1.0)
Monocytes Relative: 7 %
Neutro Abs: 4.5 10*3/uL (ref 1.7–7.7)
Neutrophils Relative %: 56 %
Platelets: 204 10*3/uL (ref 150–400)
RBC: 4.75 MIL/uL (ref 3.87–5.11)
RDW: 13.6 % (ref 11.5–15.5)
WBC: 8.1 10*3/uL (ref 4.0–10.5)
nRBC: 0 % (ref 0.0–0.2)

## 2021-11-16 LAB — MAGNESIUM: Magnesium: 2.1 mg/dL (ref 1.7–2.4)

## 2021-11-16 MED ORDER — QUETIAPINE FUMARATE 50 MG PO TABS
100.0000 mg | ORAL_TABLET | Freq: Once | ORAL | Status: AC
  Administered 2021-11-16: 100 mg via ORAL
  Filled 2021-11-16: qty 2

## 2021-11-16 NOTE — Progress Notes (Signed)
Mobility Specialist Progress Note:   11/16/21 1400  Mobility  Activity Ambulated with assistance in hallway  Level of Assistance Minimal assist, patient does 75% or more  Assistive Device Front wheel walker  Distance Ambulated (ft) 80 ft  Activity Response Tolerated well  $Mobility charge 1 Mobility   Pt eager for OOB mobility. Able to convince pt to use RW instead of SPC despite pt preference. Required minA to stand from EOB, as well as during ambulation. Pt with no overt LOB during session, knee still "popping out'. NT in room during session, changing linens and pt gown as it was soiled upon entry. Pt left sitting in the chair with chair alarm on, with daughter in room.   Addison Lank Mobility Specialist  Phone [phone]

## 2021-11-16 NOTE — Progress Notes (Signed)
PROGRESS NOTE    Yolanda Keith  ZOX:[id number] DOB: 10-02-1951 DOA: 11/14/2021 PCP: Caffie Damme, MD   Brief Narrative:  Yolanda Keith is a 71 y.o. female with medical history significant for GERD, essential hypertension, seizure disorder, who presented to [facility]  complaining of right upper quadrant abdominal pain for 1 to 2 days which was nonradiating, sharp and associated with subjective fever, nausea vomiting but no diarrhea, urinary complaints or chills.  Upon arrival to ED, her Temperature max 100.7; initial heart rate 125, oxygen saturation 99 to 100% on room air, but flasher 137/90. Potassium 3.1, Urinalysis showed no white blood cells, rare bacteria,.  Initial lactate 3.8,  COVID-19/influenza PCR negative blood cultures x2 collected prior to initiation of antibiotics.   Unremarkable chest x-ray, Noncontrast CT that showed no evidence of acute intracranial process, including no evidence of intracranial hemorrhage or any evidence of acute ischemic infarct.  CT abdomen/pelvis showed a 1.8 cm gallstone within the gallbladder itself, but in the absence of any evidence of pericholecystic fluid nor any evidence of gallbladder wall thickening, nor any evidence of common bile duct dilation and no evidence of choledocholithiasis.  Assessment & Plan:   Principal Problem:   Severe sepsis (HCC) Active Problems:   Nausea & vomiting   Right upper quadrant abdominal pain   Hypokalemia   Lactic acidosis   GERD (gastroesophageal reflux disease)   Hypertension  Abdominal pain/fever/nausea vomiting/dehydration: Patient's abdominal pain has resolved.  Her tenderness has improved as well.  No more nausea.  Last vomiting 12 hours ago.  We will now start on clear liquid diet.  She has remained afebrile with no leukocytosis.  Unremarkable procalcitonin.  UA still pending but she has no urinary symptoms.  Lactic acidosis resolved.  At this point in time, I doubt any infectious etiology and  thus I will discontinue antibiotics and monitor.  Continue PPI.  She likely has gastritis.  Hemoglobin is stable.  Sacral fracture: Pain controlled.  History of seizure: Continue Keppra.  Hypertension: Blood pressure controlled, continue amlodipine and hydrochlorothiazide.  Severe hypokalemia: Resolved  Hypomagnesemia: Resolved  Bilateral knee pain: Patient is complaining that both of her knees are popping out lately and she is hurting as well.  On my examination, they appear normal.  Nontender with no swelling and no redness.  X-ray bilateral knee negative for any acute pathology.  Per PT note, she is actually buckling and not popping out.  PT OT recommends home health PT OT.  DVT prophylaxis: enoxaparin (LOVENOX) injection 40 mg Start: 11/15/21 1315 SCDs Start: 11/15/21 0028, will start on Lovenox   Code Status: Full Code  Family Communication:  None present at bedside.  Plan of care discussed with patient in length and he/she verbalized understanding and agreed with it.  Status is: Inpatient Remains inpatient appropriate because: Work-up in progress for possible infection.  Planned Discharge Destination: Home with home health  Estimated body mass index is 32.3 kg/m as calculated from the following:   Height as of this encounter: 5\' 3"  (1.6 m).   Weight as of this encounter: 82.7 kg.    Nutritional Assessment: Body mass index is 32.3 kg/m.Marland Kitchen Seen by dietician.  I agree with the assessment and plan as outlined below: Nutrition Status:        . Skin Assessment: I have examined the patient's skin and I agree with the wound assessment as performed by the wound care RN as outlined below:    Consultants:  None  Procedures:  None  Antimicrobials:  Anti-infectives (From admission, onward)    Start     Dose/Rate Route Frequency Ordered Stop   11/15/21 1800  vancomycin (VANCOCIN) IVPB 1000 mg/200 mL premix  Status:  Discontinued        1,000 mg 200 mL/hr over 60 Minutes  Intravenous Every 24 hours 11/14/21 1737 11/15/21 1006   11/15/21 1800  cefTRIAXone (ROCEPHIN) 2 g in sodium chloride 0.9 % 100 mL IVPB  Status:  Discontinued        2 g 200 mL/hr over 30 Minutes Intravenous Every 24 hours 11/15/21 1006 11/16/21 0809   11/15/21 1100  metroNIDAZOLE (FLAGYL) IVPB 500 mg  Status:  Discontinued        500 mg 100 mL/hr over 60 Minutes Intravenous Every 12 hours 11/15/21 1006 11/16/21 0809   11/15/21 0200  aztreonam (AZACTAM) 2 g in sodium chloride 0.9 % 100 mL IVPB  Status:  Discontinued        2 g 200 mL/hr over 30 Minutes Intravenous Every 8 hours 11/14/21 1734 11/15/21 1006   11/14/21 1730  aztreonam (AZACTAM) 2 g in sodium chloride 0.9 % 100 mL IVPB        2 g 200 mL/hr over 30 Minutes Intravenous  Once 11/14/21 1715 11/14/21 2050   11/14/21 1730  metroNIDAZOLE (FLAGYL) IVPB 500 mg        500 mg 100 mL/hr over 60 Minutes Intravenous  Once 11/14/21 1715 11/14/21 2050   11/14/21 1730  vancomycin (VANCOCIN) IVPB 1000 mg/200 mL premix        1,000 mg 200 mL/hr over 60 Minutes Intravenous  Once 11/14/21 1715 11/14/21 2154         Subjective: Seen and examined.  Complains of pain at the sacral bone.  No other complaint today.  Objective: Vitals:   11/15/21 2157 11/16/21 0442 11/16/21 0500 11/16/21 0818  BP: (!) 156/98 109/87  133/82  Pulse: 96 81  84  Resp: 17   18  Temp: 98.1 F (36.7 C) 99.6 F (37.6 C)  98.4 F (36.9 C)  TempSrc: Oral Oral  Oral  SpO2: 99% 97%  100%  Weight: 76.4 kg  82.7 kg   Height:        Intake/Output Summary (Last 24 hours) at 11/16/2021 1010 Last data filed at 11/15/2021 1749 Gross per 24 hour  Intake 233.53 ml  Output 500 ml  Net -266.47 ml    Filed Weights   11/15/21 0449 11/15/21 2157 11/16/21 0500  Weight: 80.8 kg 76.4 kg 82.7 kg    Examination:  General exam: Appears calm and comfortable  Respiratory system: Clear to auscultation. Respiratory effort normal. Cardiovascular system: S1 & S2 heard, RRR. No  JVD, murmurs, rubs, gallops or clicks. No pedal edema. Gastrointestinal system: Abdomen is nondistended, soft and very minimal epigastric tenderness, no right upper quadrant tenderness today. No organomegaly or masses felt. Normal bowel sounds heard. Central nervous system: Alert and oriented. No focal neurological deficits. Extremities: Symmetric 5 x 5 power. Skin: No rashes, lesions or ulcers.  Psychiatry: Judgement and insight appear normal. Mood & affect appropriate.    Data Reviewed: I have personally reviewed following labs and imaging studies  CBC: Recent Labs  Lab 11/14/21 1418 11/15/21 0052 11/16/21 0147  WBC 10.0 11.0* 8.1  NEUTROABS  --  7.3 4.5  HGB 14.5 13.1 13.3  HCT 44.1 38.9 40.2  MCV 83.8 82.9 84.6  PLT 250 205 204    Basic Metabolic Panel: Recent Labs  Lab 11/14/21 1418 11/15/21 0052 11/16/21 0147  NA 141 140 141  K 3.1* 2.4* 4.0  CL 96* 99 108  CO2 30 28 23   GLUCOSE 137* 123* 97  BUN 12 7* 9  CREATININE 0.93 0.87 0.91  CALCIUM 10.9* 9.8 9.8  MG  --  1.6* 2.1    GFR: Estimated Creatinine Clearance: 59.4 mL/min (by C-G formula based on SCr of 0.91 mg/dL). Liver Function Tests: Recent Labs  Lab 11/14/21 1418 11/15/21 0052 11/16/21 0147  AST 36 40 49*  ALT 21 20 22   ALKPHOS 88 75 69  BILITOT 0.6 0.5 0.8  PROT 8.6* 7.5 6.4*  ALBUMIN 5.3* 4.5 4.0    Recent Labs  Lab 11/14/21 1418  LIPASE 27    Recent Labs  Lab 11/14/21 1621  AMMONIA 25    Coagulation Profile: Recent Labs  Lab 11/14/21 2053  INR 0.9    Cardiac Enzymes: Recent Labs  Lab 11/15/21 0052  CKTOTAL 868*    BNP (last 3 results) No results for input(s): PROBNP in the last 8760 hours. HbA1C: No results for input(s): HGBA1C in the last 72 hours. CBG: Recent Labs  Lab 11/14/21 1613 11/14/21 1739  GLUCAP 167* 116*    Lipid Profile: No results for input(s): CHOL, HDL, LDLCALC, TRIG, CHOLHDL, LDLDIRECT in the last 72 hours. Thyroid Function Tests: Recent  Labs    11/15/21 0052  TSH 1.692    Anemia Panel: No results for input(s): VITAMINB12, FOLATE, FERRITIN, TIBC, IRON, RETICCTPCT in the last 72 hours. Sepsis Labs: Recent Labs  Lab 11/14/21 1621 11/14/21 2051 11/15/21 0052  PROCALCITON  --   --  <0.10  LATICACIDVEN 3.8* 1.5 1.4     Recent Results (from the past 240 hour(s))  Resp Panel by RT-PCR (Flu A&B, Covid) Nasopharyngeal Swab     Status: None   Collection Time: 11/14/21  4:00 PM   Specimen: Nasopharyngeal Swab; Nasopharyngeal(NP) swabs in vial transport medium  Result Value Ref Range Status   SARS Coronavirus 2 by RT PCR NEGATIVE NEGATIVE Final    Comment: (NOTE) SARS-CoV-2 target nucleic acids are NOT DETECTED.  The SARS-CoV-2 RNA is generally detectable in upper respiratory specimens during the acute phase of infection. The lowest concentration of SARS-CoV-2 viral copies this assay can detect is 138 copies/mL. A negative result does not preclude SARS-Cov-2 infection and should not be used as the sole basis for treatment or other patient management decisions. A negative result may occur with  improper specimen collection/handling, submission of specimen other than nasopharyngeal swab, presence of viral mutation(s) within the areas targeted by this assay, and inadequate number of viral copies(<138 copies/mL). A negative result must be combined with clinical observations, patient history, and epidemiological information. The expected result is Negative.  Fact Sheet for Patients:  11/17/21  Fact Sheet for Healthcare Providers:  11/16/21  This test is no t yet approved or cleared by the [employer] FDA and  has been authorized for detection and/or diagnosis of SARS-CoV-2 by FDA under an Emergency Use Authorization (EUA). This EUA will remain  in effect (meaning this test can be used) for the duration of the COVID-19 declaration under Section  564(b)(1) of the Act, 21 U.S.C.section 360bbb-3(b)(1), unless the authorization is terminated  or revoked sooner.       Influenza A by PCR NEGATIVE NEGATIVE Final   Influenza B by PCR NEGATIVE NEGATIVE Final    Comment: (NOTE) The Xpert Xpress SARS-CoV-2/FLU/RSV plus assay is intended as an aid in the  diagnosis of influenza from Nasopharyngeal swab specimens and should not be used as a sole basis for treatment. Nasal washings and aspirates are unacceptable for Xpert Xpress SARS-CoV-2/FLU/RSV testing.  Fact Sheet for Patients: [employer]  Fact Sheet for Healthcare Providers: SeriousBroker.it  This test is not yet approved or cleared by the [place] FDA and has been authorized for detection and/or diagnosis of SARS-CoV-2 by FDA under an Emergency Use Authorization (EUA). This EUA will remain in effect (meaning this test can be used) for the duration of the COVID-19 declaration under Section 564(b)(1) of the Act, 21 U.S.C. section 360bbb-3(b)(1), unless the authorization is terminated or revoked.  Performed at [facility], [address] [place], [place] [address]   Blood culture (routine x 2)     Status: None (Preliminary result)   Collection Time: 11/14/21  4:20 PM   Specimen: BLOOD  Result Value Ref Range Status   Specimen Description   Final    BLOOD BLOOD RIGHT FOREARM Performed at [facility], [address] [place], [place] [address]    Special Requests   Final    BOTTLES DRAWN AEROBIC AND ANAEROBIC Blood Culture adequate volume Performed at [facility], [address] [place], [place] [address]    Culture   Final    NO GROWTH < 24 HOURS Performed at [facility], [address] [place], [place] [address]    Report Status PENDING  Incomplete  Blood culture (routine x 2)     Status: None (Preliminary result)   Collection Time: 11/14/21  4:21 PM   Specimen:  BLOOD  Result Value Ref Range Status   Specimen Description   Final    BLOOD BLOOD LEFT HAND Performed at [facility], [address] [place], [place] [address]    Special Requests   Final    BOTTLES DRAWN AEROBIC ONLY Blood Culture adequate volume Performed at [facility], [address] [place], [place] [address]    Culture   Final    NO GROWTH < 24 HOURS Performed at [facility], [address], [place], [place] [address]    Report Status PENDING  Incomplete  MRSA Next Gen by PCR, Nasal     Status: None   Collection Time: 11/14/21  8:22 PM   Specimen: Nasal Mucosa; Nasal Swab  Result Value Ref Range Status   MRSA by PCR Next Gen NOT DETECTED NOT DETECTED Final    Comment: (NOTE) The GeneXpert MRSA Assay (FDA approved for NASAL specimens only), is one component of a comprehensive MRSA colonization surveillance program. It is not intended to diagnose MRSA infection nor to guide or monitor treatment for MRSA infections. Test performance is not FDA approved in patients less than 22 years old. Performed at [facility], [address], [place], [place] [address]       Radiology Studies: DG Knee 1-2 Views Left  Result Date: 11/15/2021 CLINICAL DATA:  Bilateral knee pain EXAM: LEFT KNEE - 1-2 VIEW COMPARISON:  None. 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 The hardware appears intact. No acute fracture or dislocation identified. Small suprapatellar density could represent a small joint effusion. IMPRESSION: No acute osseous abnormality identified. Electronically Signed   By: Jannifer Hick M.D.   On: 11/15/2021 13:06   DG Knee 1-2 Views Right  Result Date: 11/15/2021 CLINICAL DATA:  Bilateral knee pain EXAM: RIGHT  KNEE - 1-2 VIEW COMPARISON:  None. FINDINGS: No acute fracture or dislocation identified. Previous total arthroplasty surgical changes, the hardware appears intact. Suprapatellar density  consistent with joint effusion. IMPRESSION: No acute osseous abnormality identified. Evidence of joint effusion. Electronically Signed   By: Jannifer Hickelaney  Williams M.D.   On: 11/15/2021 13:05   CT Head Wo Contrast  Result Date: 11/14/2021 CLINICAL DATA:  Acute neuro deficit. Stroke suspected. Vomiting and chills starting last night. Seen in ER for recent fall 11/12/2021. EXAM: CT HEAD WITHOUT CONTRAST TECHNIQUE: Contiguous axial images were obtained from the base of the skull through the vertex without intravenous contrast. RADIATION DOSE REDUCTION: This exam was performed according to the departmental dose-optimization program which includes automated exposure control, adjustment of the mA and/or kV according to patient size and/or use of iterative reconstruction technique. COMPARISON:  None. FINDINGS: Brain: The ventricles are normal in size and configuration. The basilar cisterns are patent. No mass, mass effect, or midline shift. No acute intracranial hemorrhage is seen. No abnormal extra-axial fluid collection. Preservation of the normal cortical gray-white interface without CT evidence of an acute major vascular territorial cortical based infarction. Vascular: No hyperdense vessel or unexpected calcification. Skull: Normal. Negative for fracture or focal lesion. Sinuses/Orbits: The visualized orbits are unremarkable. The visualized paranasal sinuses and mastoid air cells are clear. Other: None. IMPRESSION: No acute intracranial process. Electronically Signed   By: Neita Garnetonald  Viola M.D.   On: 11/14/2021 17:18   CT ABDOMEN PELVIS W CONTRAST  Result Date: 11/14/2021 CLINICAL DATA:  Right lower quadrant abdominal pain. Vomiting and chills. EXAM: CT ABDOMEN AND PELVIS WITH CONTRAST TECHNIQUE: Multidetector CT imaging of the abdomen and pelvis was performed using the standard protocol following bolus administration of intravenous contrast. RADIATION DOSE REDUCTION: This exam was performed according to the  departmental dose-optimization program which includes automated exposure control, adjustment of the mA and/or kV according to patient size and/or use of iterative reconstruction technique. CONTRAST:  100mL OMNIPAQUE IOHEXOL 300 MG/ML  SOLN COMPARISON:  None. FINDINGS: Lower chest: Small type 1 hiatal hernia. Hepatobiliary: Faint rim calcified gallstone measuring 1.8 cm noted dependently in the gallbladder. No biliary dilatation. Mild focal steatosis in segment 4 of the liver adjacent to the falciform ligament. Pancreas: Unremarkable Spleen: Unremarkable Adrenals/Urinary Tract: 6 by 9 mm hypodense lesion medially along the left kidney upper pole is likely a cyst although technically too small to characterize. Adrenal glands unremarkable. Contrast medium in the collecting systems on the initial images, presumably due to some kind of test injection, which reduces sensitivity for nonobstructive calculi. No hydronephrosis or hydroureter. Nondistended urinary bladder. Stomach/Bowel: Scattered sigmoid colon diverticula. Appendix surgically absent. Vascular/Lymphatic: Atherosclerosis is present, including aortoiliac atherosclerotic disease. Reproductive: Uterus absent.  Adnexa unremarkable. Other: No supplemental non-categorized findings. Musculoskeletal: Lumbar spondylosis and degenerative disc disease causing substantial bilateral foraminal impingement L3-4, L4-5, and L5-S1. The previous radiographic findings suspicious for sacral fracture are less convincing on today's CT examination. IMPRESSION: 1. A cause for the patient's right lower quadrant abdominal pain is not identified. There is no dilated bowel. The appendix is surgically absent. 2. 1.8 gallstone in the gallbladder noted but without pericholecystic fluid or gallbladder wall thickening. 3. There are findings suspicious for sacral fracture on the recent conventional radiographs, although the appearance is less suspicious on today's CT. 4. Atherosclerosis is  present, including aortoiliac atherosclerotic disease. 5. Substantial foraminal impingement at L3-4, L4-5, and L5-S1. 6. Small type 1 hiatal hernia. Electronically Signed   By: Annitta NeedsWalter  Liebkemann M.D.  On: 11/14/2021 17:23   DG Chest Port 1 View  Result Date: 11/14/2021 CLINICAL DATA:  Questionable sepsis. EXAM: PORTABLE CHEST 1 VIEW COMPARISON:  None. FINDINGS: The heart size and mediastinal contours are within normal limits. Both lungs are clear. The visualized skeletal structures are unremarkable. IMPRESSION: No active disease. Electronically Signed   By: Darliss CheneyAmy  Guttmann M.D.   On: 11/14/2021 17:41   US Abdomen Limited RUQ (LIVER/GB)  Result Date: 11/14/2021 CLINICAL DATA:  Abdominal pain EXAM: ULTRASOUND ABDOMEN LIMITED RIGHT UPPER QUADRANT COMPARISON:  11/14/2021 CT abdomen pelvis FINDINGS: Gallbladder: Large peripherally calcified stone within the gallbladder, which measures up to 2.1 cm. No wall thickening. No pericholecystic fluid. No sonographic Murphy sign noted by sonographer. Common bile duct: Diameter: 5 mm Liver: No focal lesion identified. Increased parenchymal echogenicity. Portal vein is patent on color Doppler imaging with normal direction of blood flow towards the liver. Other: None. IMPRESSION: 1. Cholelithiasis without evidence of cholecystitis. 2. Hepatic steatosis. Electronically Signed   By: Wiliam KeAlison  Vasan M.D.   On: 11/14/2021 19:35    Scheduled Meds:  amLODipine  10 mg Oral Daily   busPIRone  10 mg Oral TID   enoxaparin (LOVENOX) injection  40 mg Subcutaneous Q24H   hydrochlorothiazide  25 mg Oral Daily   levETIRAcetam  500 mg Oral BID   melatonin  3 mg Oral QHS   metoprolol tartrate  25 mg Oral BID   pantoprazole (PROTONIX) IV  40 mg Intravenous Q12H   pregabalin  50 mg Oral TID   Continuous Infusions:  0.9 % NaCl with KCl 40 mEq / L 100 mL/hr at 11/15/21 1338     LOS: 2 days   Time spent: 28 minutes  Hughie Clossavi Sandia Pfund, MD [facility]  11/16/2021, 10:10 AM   Please page via Loretha StaplerAmion and do not message via secure chat for urgent patient care matters. Secure chat can be used for non urgent patient care matters.  How to contact the [facility] Attending or Consulting provider 7A - 7P or covering provider during after hours 7P -7A, for this patient?  Check the care team in [facility] and look for a) attending/consulting [facility] provider listed and b) the [facility] team listed. Page or secure chat 7A-7P. Log into [email or web address] and use [place]'s universal password to access. If you do not have the password, please contact the hospital operator. Locate the [facility] provider you are looking for under [facility] and page to a number that you can be directly reached. If you still have difficulty reaching the provider, please page the [facility] (Director on Call) for the Hospitalists listed on amion for assistance.

## 2021-11-16 NOTE — Evaluation (Signed)
Occupational Therapy Evaluation Patient Details Name: Yolanda Keith MRN: [id number] DOB: October 22, 1950 Today's Date: 11/16/2021   History of Present Illness Pt is a 71 y.o. female admitted 11/14/21 with c/o RUQ pain, severe sepsis of unclear source. Abdominal CT with large gallstone. Pt also reports bilateral knee buckling; has h/o bilateral TKA. PMH includes HTN, HLD, arthritis, seizure disorder   Clinical Impression   Pt reports walking with a cane short distances and using a motorized w/c for longer distances. She functions modified independently in ADL, but endorsed difficulty donning socks, particularly the L. Pt presents with generalized weakness and impaired standing balance with knee buckling x 1. She ambulated with hand held assist and support of IV pole. Will follow acutely. Recommending HHOT to address IADLs.     Recommendations for follow up therapy are one component of a multi-disciplinary discharge planning process, led by the attending physician.  Recommendations may be updated based on patient status, additional functional criteria and insurance authorization.   Follow Up Recommendations  Home health OT    Assistance Recommended at Discharge Frequent or constant Supervision/Assistance (initially upon returning home)  Patient can return home with the following A little help with walking and/or transfers;A little help with bathing/dressing/bathroom;Assistance with cooking/housework;Assist for transportation;Help with stairs or ramp for entrance    Functional Status Assessment  Patient has had a recent decline in their functional status and demonstrates the ability to make significant improvements in function in a reasonable and predictable amount of time.  Equipment Recommendations  BSC/3in1    Recommendations for Other Services       Precautions / Restrictions Precautions Precautions: Fall;Other (comment) Precaution Comments: enteric Restrictions Weight Bearing  Restrictions: No      Mobility Bed Mobility Overal bed mobility: Independent             General bed mobility comments: returned to supine    Transfers Overall transfer level: Needs assistance Equipment used: 1 person hand held assist Transfers: Sit to/from Stand Sit to Stand: Min guard           General transfer comment: used IV pole and hand held assist to ambulate with knee buckling x 1      Balance Overall balance assessment: Needs assistance   Sitting balance-Leahy Scale: Good       Standing balance-Leahy Scale: Poor Standing balance comment: reliant on at least single UE support to stabilize                           ADL either performed or assessed with clinical judgement   ADL Overall ADL's : Needs assistance/impaired Eating/Feeding: Independent;Bed level   Grooming: Wash/dry hands;Standing;Min guard   Upper Body Bathing: Set up;Sitting   Lower Body Bathing: Minimal assistance;Sit to/from stand   Upper Body Dressing : Set up;Sitting   Lower Body Dressing: Minimal assistance;Sitting/lateral leans Lower Body Dressing Details (indicate cue type and reason): pt typically pulls pants up, leaning side to side in sitting Toilet Transfer: Minimal assistance;Ambulation   Toileting- Clothing Manipulation and Hygiene: Set up;Sitting/lateral lean       Functional mobility during ADLs: Minimal assistance (hand held and use of IV pole)       Vision Ability to See in Adequate Light: 0 Adequate Patient Visual Report: No change from baseline       Perception     Praxis      Pertinent Vitals/Pain Pain Assessment Pain Assessment: 0-10 Pain Score: 6  Pain Location: abdomen,  knees Pain Descriptors / Indicators: Discomfort Pain Intervention(s): Monitored during session, Repositioned     Hand Dominance Right   Extremity/Trunk Assessment Upper Extremity Assessment Upper Extremity Assessment: Overall WFL for tasks assessed;Generalized  weakness   Lower Extremity Assessment Lower Extremity Assessment: Defer to PT evaluation   Cervical / Trunk Assessment Cervical / Trunk Assessment: Kyphotic   Communication Communication Communication: No difficulties   Cognition Arousal/Alertness: Awake/alert Behavior During Therapy: WFL for tasks assessed/performed Overall Cognitive Status: Within Functional Limits for tasks assessed                                       General Comments  increased time discussing safe d/c plan, including recommendation to reach out to family for initial increased support upon return home; pt values her independence and wants to be with her dog    Exercises     Shoulder Instructions      Home Living Family/patient expects to be discharged to:: Private residence Living Arrangements: Alone Available Help at Discharge: Family;Available 24 hours/day Type of Home: House Home Access: Stairs to enter [employer] of Steps: 4 Entrance Stairs-Rails: None Home Layout: One level     Bathroom Shower/Tub: Chief Strategy Officer: Standard     Home Equipment: Agricultural consultant (2 wheels);Cane - single point;Wheelchair - power;Shower seat   Additional Comments: pt reports having a lot of assist of family as needed      Prior Functioning/Environment Prior Level of Function : Needs assist             Mobility Comments: Reports mod indep ambulating household distances with SPC and holding onto furniture (hates using walker, not interested in ever using); uses electric scooter for community distances, but has needed around house recently due to worsening pain ADLs Comments: Typically mod indep for ADLs, but was struggling to don L sock PTA .Daughter drives ("I'm not allowed to drive until neurology clears me")        OT Problem List: Decreased strength;Impaired balance (sitting and/or standing);Decreased knowledge of use of DME or AE;Decreased safety  awareness;Pain      OT Treatment/Interventions: Self-care/ADL training;Therapeutic activities;Patient/family education;Balance training;DME and/or AE instruction    OT Goals(Current goals can be found in the care plan section) Acute Rehab OT Goals OT Goal Formulation: With patient Time For Goal Achievement: 11/30/21 Potential to Achieve Goals: Good ADL Goals Pt Will Perform Grooming: with supervision;standing (3 activities) Pt Will Perform Lower Body Bathing: with supervision;sitting/lateral leans Pt Will Perform Lower Body Dressing: with supervision;sitting/lateral leans (with sock aid as needed) Pt Will Transfer to Toilet: with supervision;ambulating;bedside commode (over toilet) Pt Will Perform Toileting - Clothing Manipulation and hygiene: with supervision;sit to/from stand;sitting/lateral leans Additional ADL Goal #1: Pt will be knowledgeable in uses of 3 in 1.  OT Frequency: Min 2X/week    Co-evaluation              AM-PAC OT "6 Clicks" Daily Activity     Outcome Measure Help from another person eating meals?: None Help from another person taking care of personal grooming?: A Little Help from another person toileting, which includes using toliet, bedpan, or urinal?: A Little Help from another person bathing (including washing, rinsing, drying)?: A Little Help from another person to put on and taking off regular upper body clothing?: None Help from another person to put on and taking off regular lower body clothing?:  A Little 6 Click Score: 20   End of Session Equipment Utilized During Treatment: Gait belt  Activity Tolerance: Patient tolerated treatment well Patient left: in bed;with call bell/phone within reach;with bed alarm set  OT Visit Diagnosis: Unsteadiness on feet (R26.81);Other abnormalities of gait and mobility (R26.89);Pain;Muscle weakness (generalized) (M62.81)                Time: 1035-1050 OT Time Calculation (min): 15 min Charges:  OT General  Charges $OT Visit: 1 Visit OT Evaluation $OT Eval Moderate Complexity: 1 Mod  Martie Round, OTR/L Acute Rehabilitation Services Pager: [phone] Office: [phone]  Evern Bio 11/16/2021, 11:07 AM

## 2021-11-16 NOTE — TOC Initial Note (Addendum)
Transition of Care [facility]) - Initial/Assessment Note    Patient Details  Name: Yolanda Keith MRN: [id number] Date of Birth: [birth year]  [facility]) CM/SW Contact:    Kingsley Plan, RN Phone Number: 11/16/2021, 10:44 AM  Clinical Narrative:                 Spoke to patient at bedside. Patient from home alone, however she has 20 grand children who are planning on taking turns staying with her.   Patient has a cane and scooter at home already. Needs 3 in 1. Same ordered with Velna Hatchet with [facility].   Discussed home health PT/OT , patient agreeable.   Amy with [facility] has no staffing for [facility] Medicare patient's.   Left message for Marylene Land with [facility] unable to accept.   Jenn with [facility] does not have staffing for [facility] Medicare patient's  Tiffany with [facility] checking to see if she can accept referral. [facility] unable to accept patient   Barbara Cower with [facility] checking to see if he can accept referral. Barbara Cower unable to accept referral   Kandee Keen with [facility] can accept referral.    Ordered 3 in1 with Velna Hatchet with [facility]  Expected Discharge Plan: Home w Home Health Services Barriers to Discharge: Continued Medical Work up   Patient Goals and CMS Choice Patient states their goals for this hospitalization and ongoing recovery are:: to return to home CMS Medicare.gov Compare Post Acute Care list provided to:: Patient Choice offered to / list presented to : Patient  Expected Discharge Plan and Services Expected Discharge Plan: Home w Home Health Services   Discharge Planning Services: CM Consult Post Acute Care Choice: Home Health                   DME Arranged: N/A DME Agency: NA       HH Arranged: PT, OT          Prior Living Arrangements/Services   Lives with:: Self Patient language and need for interpreter reviewed:: Yes Do you feel safe going back to the place where you live?: Yes      Need for Family Participation in  Patient Care: Yes (Comment) Care giver support system in place?: Yes (comment) Current home services: DME Criminal Activity/Legal Involvement Pertinent to Current Situation/Hospitalization: No - Comment as needed  Activities of Daily Living Home Assistive Devices/Equipment: Cane (specify quad or straight) ADL Screening (condition at time of admission) Patient's cognitive ability adequate to safely complete daily activities?: Yes Is the patient deaf or have difficulty hearing?: No Does the patient have difficulty seeing, even when wearing glasses/contacts?: No Does the patient have difficulty concentrating, remembering, or making decisions?: No Patient able to express need for assistance with ADLs?: Yes Does the patient have difficulty dressing or bathing?: No Independently performs ADLs?: Yes (appropriate for developmental age) Does the patient have difficulty walking or climbing stairs?: No Weakness of Legs: Both Weakness of Arms/Hands: Both  Permission Sought/Granted   Permission granted to share information with : No              Emotional Assessment Appearance:: Appears stated age Attitude/Demeanor/Rapport: Engaged Affect (typically observed): Accepting Orientation: : Oriented to Self, Oriented to Place, Oriented to  Time, Oriented to Situation Alcohol / Substance Use: Not Applicable Psych Involvement: No (comment)  Admission diagnosis:  Acute cholecystitis [K81.0] Lactic acidosis [E87.20] Toxic metabolic encephalopathy [G92.8] Abdominal pain [R10.9] Nausea and vomiting, unspecified vomiting type [R11.2] Patient  Active Problem List   Diagnosis Date Noted   Severe sepsis (HCC) 11/15/2021   Nausea & vomiting 11/15/2021   Right upper quadrant abdominal pain 11/15/2021   Hypokalemia 11/15/2021   Lactic acidosis 11/15/2021   GERD (gastroesophageal reflux disease)    Hypertension    Acute cholecystitis 11/14/2021   PCP:  Caffie Damme, MD Pharmacy:   [facility] [phone] - [place], [place] - [address] [place] [address] Phone: [phone] Fax: [phone]  [facility] [address], [address] [place] [address] Phone: [phone] Fax: [phone]     Social Determinants of Health (SDOH) Interventions    Readmission Risk Interventions No flowsheet data found.

## 2021-11-16 NOTE — Evaluation (Signed)
Physical Therapy Evaluation Patient Details Name: Yolanda Keith MRN: [id number] DOB: 09/08/1951 Today's Date: 11/16/2021  History of Present Illness  Pt is a 71 y.o. female admitted 11/14/21 with c/o RUQ pain, severe sepsis of unclear source. Abdominal CT with large gallstone. Pt also reports bilateral knee buckling; has h/o bilateral TKA. PMH includes HTN, HLD, arthritis, seizure disorder.   Clinical Impression  Pt presents with an overall decrease in functional mobility secondary to above. PTA, pt reports mod indep with SPC and electric scooter, has had increased difficulty mobilizing at home due to abdominal pain and knees buckling. Today, pt tolerated short bouts of mobility with BUE support and close min guard for balance (pt adamant against RW use). Increased time educ on fall risk reduction and safety at home, including recommendation for increased (at least initially) family support upon return home; pt in agreement, but also values her independence. Pt would benefit from continued acute PT services to maximize functional mobility and independence prior to d/c with HHPT services.      Recommendations for follow up therapy are one component of a multi-disciplinary discharge planning process, led by the attending physician.  Recommendations may be updated based on patient status, additional functional criteria and insurance authorization.  Follow Up Recommendations Home health PT    Assistance Recommended at Discharge Intermittent Supervision/Assistance  Patient can return home with the following  A little help with walking and/or transfers;A little help with bathing/dressing/bathroom;Assistance with cooking/housework;Assist for transportation;Help with stairs or ramp for entrance    Equipment Recommendations BSC/3in1  Recommendations for Other Services   Occupational Therapy    Functional Status Assessment Patient has had a recent decline in their functional status and demonstrates the  ability to make significant improvements in function in a reasonable and predictable amount of time.     Precautions / Restrictions Precautions Precautions: Fall;Other (comment) Precaution Comments: abdominal precautions for comfort Restrictions Weight Bearing Restrictions: No      Mobility  Bed Mobility Overal bed mobility: Independent             General bed mobility comments: bed flat, able to perform log roll with educ on technique    Transfers Overall transfer level: Needs assistance Equipment used: Straight cane Transfers: Sit to/from Stand Sit to Stand: Min guard           General transfer comment: increased time and effort standing from EOB and low toilet height with SPC, reliant on pulling grab bar and/or reaching to IV pole for additional UE support to stabilize; pt declines use of RW    Ambulation/Gait Ambulation/Gait assistance: Min guard Gait Distance (Feet): 34 Feet Assistive device: Straight cane, IV Pole Gait Pattern/deviations: Step-to pattern, Step-through pattern, Decreased stride length, Trunk flexed, Antalgic Gait velocity: Decreased     General Gait Details: Slow, antalgic gait with SPC, pt preferring to hold onto IV pole with other arm (adamant against RW use; reports SPC + furniture at home); close min guard for balance; pt notes knees "not buckling as bad as they were"  Stairs            Wheelchair Mobility    Modified Rankin (Stroke Patients Only)       Balance Overall balance assessment: Needs assistance   Sitting balance-Leahy Scale: Fair Sitting balance - Comments: able to perform pericare sitting on toilet     Standing balance-Leahy Scale: Poor Standing balance comment: reliant on at least single UE support to stabilize  Pertinent Vitals/Pain Pain Assessment Pain Assessment: Faces Faces Pain Scale: Hurts little more Pain Location: abdomen, knees Pain Descriptors /  Indicators: Discomfort Pain Intervention(s): Monitored during session, Limited activity within patient's tolerance    Home Living Family/patient expects to be discharged to:: Private residence Living Arrangements: Alone Available Help at Discharge: Family;Available PRN/intermittently Type of Home: House Home Access: Stairs to enter Entrance Stairs-Rails: None Entrance Stairs-Number of Steps: 4   Home Layout: One level Home Equipment: Conservation officer, nature (2 wheels);Cane - single point;Wheelchair - power;Shower seat Additional Comments: reports granddaughter likely planning to stay to with her at d/c; encouraged initial increased family support    Prior Function Prior Level of Function : Needs assist             Mobility Comments: Reports mod indep ambulating household distances with SPC and holding onto furniture (hates using walker, not interested in ever using); uses electric scooter for community distances, but has needed around house recently due to worsening pain ADLs Comments: Typically mod indep for ADLs. Daughter drives ("I'm not allowed to drive until neurology clears me")     Hand Dominance        Extremity/Trunk Assessment   Upper Extremity Assessment Upper Extremity Assessment: Generalized weakness    Lower Extremity Assessment Lower Extremity Assessment: Generalized weakness    Cervical / Trunk Assessment Cervical / Trunk Assessment: Kyphotic  Communication   Communication: No difficulties  Cognition Arousal/Alertness: Awake/alert Behavior During Therapy: WFL for tasks assessed/performed Overall Cognitive Status: Within Functional Limits for tasks assessed                                          General Comments General comments (skin integrity, edema, etc.): increased time discussing safe d/c plan, including recommendation to reach out to family for initial increased support upon return home; pt values her independence and wants to be with  her dog    Exercises     Assessment/Plan    PT Assessment Patient needs continued PT services  PT Problem List Decreased strength;Decreased activity tolerance;Decreased balance;Decreased mobility;Decreased knowledge of use of DME;Decreased knowledge of precautions;Pain       PT Treatment Interventions DME instruction;Gait training;Stair training;Functional mobility training;Therapeutic activities;Therapeutic exercise;Balance training;Patient/family education    PT Goals (Current goals can be found in the Care Plan section)  Acute Rehab PT Goals Patient Stated Goal: Return home to dog "Queen" PT Goal Formulation: With patient Time For Goal Achievement: 11/30/21 Potential to Achieve Goals: Good    Frequency Min 3X/week     Co-evaluation               AM-PAC PT "6 Clicks" Mobility  Outcome Measure Help needed turning from your back to your side while in a flat bed without using bedrails?: None Help needed moving from lying on your back to sitting on the side of a flat bed without using bedrails?: None Help needed moving to and from a bed to a chair (including a wheelchair)?: A Little Help needed standing up from a chair using your arms (e.g., wheelchair or bedside chair)?: A Little Help needed to walk in hospital room?: A Little Help needed climbing 3-5 steps with a railing? : A Lot 6 Click Score: 19    End of Session Equipment Utilized During Treatment: Gait belt Activity Tolerance: Patient tolerated treatment well;Patient limited by fatigue Patient left: in chair;with call bell/phone within  reach;Other (comment) (pt declined chair alarm; educ on need to call to get up, pt reports understanding) Nurse Communication: Mobility status PT Visit Diagnosis: Other abnormalities of gait and mobility (R26.89);Muscle weakness (generalized) (M62.81)    Time: [id number] PT Time Calculation (min) (ACUTE ONLY): 26 min   Charges:   PT Evaluation $PT Eval Moderate Complexity: 1  Mod PT Treatments $Self Care/Home Management: 8-22      Mabeline Caras, PT, DPT Acute Rehabilitation Services  Pager [phone] Office [phone]  Derry Lory 11/16/2021, 9:50 AM

## 2021-11-16 NOTE — Progress Notes (Signed)
Mobility Specialist Progress Note:   11/16/21 1445  Mobility  Activity Transferred to/from [facility];Transferred from chair to bed  Level of Assistance Minimal assist, patient does 75% or more  Assistive Device Front wheel walker  Distance Ambulated (ft) 8 ft  Activity Response Tolerated well  $Mobility charge 1 Mobility   Responded to chair alarm, found pt requesting to go to [facility]. Pt became incontinent if urine during transfer. Performed pericare, go pt back in bed. Pt left lying supine in bed with bed alarm on.   Addison Lank Mobility Specialist  Phone [phone]

## 2021-11-17 LAB — BASIC METABOLIC PANEL
Anion gap: 9 (ref 5–15)
BUN: 11 mg/dL (ref 8–23)
CO2: 21 mmol/L — ABNORMAL LOW (ref 22–32)
Calcium: 9.7 mg/dL (ref 8.9–10.3)
Chloride: 107 mmol/L (ref 98–111)
Creatinine, Ser: 0.83 mg/dL (ref 0.44–1.00)
GFR, Estimated: >60 mL/min (ref >60)
Glucose, Bld: 104 mg/dL — ABNORMAL HIGH (ref 70–99)
Potassium: 4.1 mmol/L (ref 3.5–5.1)
Sodium: 137 mmol/L (ref 135–145)

## 2021-11-17 MED ORDER — QUETIAPINE FUMARATE 50 MG PO TABS
100.0000 mg | ORAL_TABLET | Freq: Every day | ORAL | Status: DC
  Administered 2021-11-17: 100 mg via ORAL
  Filled 2021-11-17: qty 2

## 2021-11-17 MED ORDER — QUETIAPINE FUMARATE 50 MG PO TABS: 25.0000 mg | ORAL_TABLET | Freq: Every day | ORAL | Status: DC

## 2021-11-17 MED ORDER — ASPIRIN 81 MG PO CHEW
81.0000 mg | CHEWABLE_TABLET | Freq: Every morning | ORAL | Status: DC
  Administered 2021-11-18: 81 mg via ORAL
  Filled 2021-11-17: qty 1

## 2021-11-17 MED ORDER — PANTOPRAZOLE SODIUM 40 MG PO TBEC: 40.0000 mg | DELAYED_RELEASE_TABLET | Freq: Every day | ORAL | Status: DC

## 2021-11-17 MED ORDER — ATORVASTATIN CALCIUM 10 MG PO TABS
20.0000 mg | ORAL_TABLET | Freq: Every evening | ORAL | Status: DC
Start: 2021-11-17 — End: 2021-11-18
  Administered 2021-11-17: 20 mg via ORAL
  Filled 2021-11-17: qty 2

## 2021-11-17 MED ORDER — DOCUSATE SODIUM 100 MG PO CAPS
100.0000 mg | ORAL_CAPSULE | Freq: Two times a day (BID) | ORAL | Status: DC
  Filled 2021-11-17 (×2): qty 1

## 2021-11-17 NOTE — Progress Notes (Signed)
Mobility Specialist Progress Note:   11/17/21 1330  Mobility  Activity Ambulated with assistance in hallway  Level of Assistance Minimal assist, patient does 75% or more  Assistive Device Front wheel walker  Distance Ambulated (ft) 80 ft  Activity Response Tolerated well  $Mobility charge 1 Mobility   Pt eager for OOB mobility. Required minA throughout session, no overt LOB however pt's R knee still "popping out". Pt left sitting up in chair with chair alarm on and all needs met. Encouraged OOB to use BR instead of bedpan, pt agreed.   Nelta Numbers Mobility Specialist  Phone [phone]

## 2021-11-17 NOTE — Progress Notes (Signed)
PROGRESS NOTE    Yolanda Keith  ZOX:[id number] DOB: [birth year] DOA: 11/14/2021 PCP: Caffie Damme, MD   Brief Narrative:  Yolanda Keith is a 71 y.o. female with medical history significant for GERD, essential hypertension, seizure disorder, who presented to med [facility]  complaining of right upper quadrant abdominal pain for 1 to 2 days which was nonradiating, sharp and associated with subjective fever, nausea vomiting but no diarrhea, urinary complaints or chills.  Upon arrival to ED, her Temperature max 100.7; initial heart rate 125, oxygen saturation 99 to 100% on room air, but flasher 137/90. Potassium 3.1, Urinalysis showed no white blood cells, rare bacteria,.  Initial lactate 3.8,  COVID-19/influenza PCR negative blood cultures x2 collected prior to initiation of antibiotics.   Unremarkable chest x-ray, Noncontrast CT that showed no evidence of acute intracranial process, including no evidence of intracranial hemorrhage or any evidence of acute ischemic infarct.  CT abdomen/pelvis showed a 1.8 cm gallstone within the gallbladder itself, but in the absence of any evidence of pericholecystic fluid nor any evidence of gallbladder wall thickening, nor any evidence of common bile duct dilation and no evidence of choledocholithiasis.  Assessment & Plan:   Principal Problem:   Severe sepsis (HCC) Active Problems:   Nausea & vomiting   Right upper quadrant abdominal pain   Hypokalemia   Lactic acidosis   GERD (gastroesophageal reflux disease)   Hypertension  Abdominal pain/fever/nausea vomiting/dehydration: Patient's abdominal pain has resolved.  Her tenderness has improved as well.  No more nausea.  36 hours ago.  Tolerating clear liquid diet.  Will advance to full liquid diet for lunch and then soft diet later today if she tolerates that.  She has remained afebrile.  Will monitor off antibiotics.  Sacral fracture: Pain controlled.  History of seizure: Continue  Keppra.  Hypertension: Blood pressure controlled, continue amlodipine and hydrochlorothiazide.  Severe hypokalemia: Resolved  Hypomagnesemia: Resolved  Bilateral knee pain: Patient is complaining that both of her knees are popping out lately and she is hurting as well.  On my examination, they appear normal.  Nontender with no swelling and no redness.  X-ray bilateral knee negative for any acute pathology.  Per PT note, she is actually buckling and not popping out.  PT OT recommends home health PT OT.  Insomnia: Patient continues to complain that she cannot sleep at night and she continues to request to give her medications during the daytime to help her sleep.  She has been advised multiple times that is not a good practice.  She had received high-dose of Seroquel 100 mg last night.  She still wants to sleep during the day.  She is requesting to prescribe her melatonin however that is already prescribed.  I will give her Seroquel 25 mg nightly again.  DVT prophylaxis: enoxaparin (LOVENOX) injection 40 mg Start: 11/15/21 1315 SCDs Start: 11/15/21 0028,    Code Status: Full Code  Family Communication:  None present at bedside.  Plan of care discussed with patient in length and he/she verbalized understanding and agreed with it.  Status is: Inpatient Remains inpatient appropriate because: Slowly advancing diet.  Potential plan to discharge tomorrow.  Planned Discharge Destination: Home with home health  Estimated body mass index is 30.38 kg/m as calculated from the following:   Height as of this encounter: 5\' 3"  (1.6 m).   Weight as of this encounter: 77.8 kg.    Nutritional Assessment: Body mass index is 30.38 kg/m.Marland Kitchen Seen by dietician.  I agree with  the assessment and plan as outlined below: Nutrition Status:        . Skin Assessment: I have examined the patient's skin and I agree with the wound assessment as performed by the wound care RN as outlined below:    Consultants:   None  Procedures:  None  Antimicrobials:  Anti-infectives (From admission, onward)    Start     Dose/Rate Route Frequency Ordered Stop   11/15/21 1800  vancomycin (VANCOCIN) IVPB 1000 mg/200 mL premix  Status:  Discontinued        1,000 mg 200 mL/hr over 60 Minutes Intravenous Every 24 hours 11/14/21 1737 11/15/21 1006   11/15/21 1800  cefTRIAXone (ROCEPHIN) 2 g in sodium chloride 0.9 % 100 mL IVPB  Status:  Discontinued        2 g 200 mL/hr over 30 Minutes Intravenous Every 24 hours 11/15/21 1006 11/16/21 0809   11/15/21 1100  metroNIDAZOLE (FLAGYL) IVPB 500 mg  Status:  Discontinued        500 mg 100 mL/hr over 60 Minutes Intravenous Every 12 hours 11/15/21 1006 11/16/21 0809   11/15/21 0200  aztreonam (AZACTAM) 2 g in sodium chloride 0.9 % 100 mL IVPB  Status:  Discontinued        2 g 200 mL/hr over 30 Minutes Intravenous Every 8 hours 11/14/21 1734 11/15/21 1006   11/14/21 1730  aztreonam (AZACTAM) 2 g in sodium chloride 0.9 % 100 mL IVPB        2 g 200 mL/hr over 30 Minutes Intravenous  Once 11/14/21 1715 11/14/21 2050   11/14/21 1730  metroNIDAZOLE (FLAGYL) IVPB 500 mg        500 mg 100 mL/hr over 60 Minutes Intravenous  Once 11/14/21 1715 11/14/21 2050   11/14/21 1730  vancomycin (VANCOCIN) IVPB 1000 mg/200 mL premix        1,000 mg 200 mL/hr over 60 Minutes Intravenous  Once 11/14/21 1715 11/14/21 2154         Subjective: Seen and examined.  Feels much better.  No nausea.  No abdominal pain.  Objective: Vitals:   11/16/21 2040 11/16/21 2044 11/17/21 0419 11/17/21 0824  BP: (!) 135/106  (!) 133/94 (!) 129/95  Pulse: 91  68 86  Resp: 16  16 18   Temp: 98.8 F (37.1 C)  98.3 F (36.8 C) 99 F (37.2 C)  TempSrc:    Oral  SpO2: 93%  100% 100%  Weight:  77.8 kg    Height:        Intake/Output Summary (Last 24 hours) at 11/17/2021 1054 Last data filed at 11/17/2021 0546 Gross per 24 hour  Intake 2003.33 ml  Output --  Net 2003.33 ml    Filed Weights    11/15/21 2157 11/16/21 0500 11/16/21 2044  Weight: 76.4 kg 82.7 kg 77.8 kg    Examination:  General exam: Appears calm and comfortable  Respiratory system: Clear to auscultation. Respiratory effort normal. Cardiovascular system: S1 & S2 heard, RRR. No JVD, murmurs, rubs, gallops or clicks. No pedal edema. Gastrointestinal system: Abdomen is nondistended, soft and nontender. No organomegaly or masses felt. Normal bowel sounds heard. Central nervous system: Alert and oriented. No focal neurological deficits. Extremities: Symmetric 5 x 5 power. Skin: No rashes, lesions or ulcers.  Psychiatry: Judgement and insight appear normal. Mood & affect appropriate.    Data Reviewed: I have personally reviewed following labs and imaging studies  CBC: Recent Labs  Lab 11/14/21 1418 11/15/21 0052 11/16/21 0147  WBC 10.0 11.0* 8.1  NEUTROABS  --  7.3 4.5  HGB 14.5 13.1 13.3  HCT 44.1 38.9 40.2  MCV 83.8 82.9 84.6  PLT 250 205 204    Basic Metabolic Panel: Recent Labs  Lab 11/14/21 1418 11/15/21 0052 11/16/21 0147 11/17/21 0921  NA 141 140 141 137  K 3.1* 2.4* 4.0 4.1  CL 96* 99 108 107  CO2 30 28 23  21*  GLUCOSE 137* 123* 97 104*  BUN 12 7* 9 11  CREATININE 0.93 0.87 0.91 0.83  CALCIUM 10.9* 9.8 9.8 9.7  MG  --  1.6* 2.1  --     GFR: Estimated Creatinine Clearance: 63.2 mL/min (by C-G formula based on SCr of 0.83 mg/dL). Liver Function Tests: Recent Labs  Lab 11/14/21 1418 11/15/21 0052 11/16/21 0147  AST 36 40 49*  ALT 21 20 22   ALKPHOS 88 75 69  BILITOT 0.6 0.5 0.8  PROT 8.6* 7.5 6.4*  ALBUMIN 5.3* 4.5 4.0    Recent Labs  Lab 11/14/21 1418  LIPASE 27    Recent Labs  Lab 11/14/21 1621  AMMONIA 25    Coagulation Profile: Recent Labs  Lab 11/14/21 2053  INR 0.9    Cardiac Enzymes: Recent Labs  Lab 11/15/21 0052  CKTOTAL 868*    BNP (last 3 results) No results for input(s): PROBNP in the last 8760 hours. HbA1C: No results for input(s):  HGBA1C in the last 72 hours. CBG: Recent Labs  Lab 11/14/21 1613 11/14/21 1739  GLUCAP 167* 116*    Lipid Profile: No results for input(s): CHOL, HDL, LDLCALC, TRIG, CHOLHDL, LDLDIRECT in the last 72 hours. Thyroid Function Tests: Recent Labs    11/15/21 0052  TSH 1.692    Anemia Panel: No results for input(s): VITAMINB12, FOLATE, FERRITIN, TIBC, IRON, RETICCTPCT in the last 72 hours. Sepsis Labs: Recent Labs  Lab 11/14/21 1621 11/14/21 2051 11/15/21 0052  PROCALCITON  --   --  <0.10  LATICACIDVEN 3.8* 1.5 1.4     Recent Results (from the past 240 hour(s))  Resp Panel by RT-PCR (Flu A&B, Covid) Nasopharyngeal Swab     Status: None   Collection Time: 11/14/21  4:00 PM   Specimen: Nasopharyngeal Swab; Nasopharyngeal(NP) swabs in vial transport medium  Result Value Ref Range Status   SARS Coronavirus 2 by RT PCR NEGATIVE NEGATIVE Final    Comment: (NOTE) SARS-CoV-2 target nucleic acids are NOT DETECTED.  The SARS-CoV-2 RNA is generally detectable in upper respiratory specimens during the acute phase of infection. The lowest concentration of SARS-CoV-2 viral copies this assay can detect is 138 copies/mL. A negative result does not preclude SARS-Cov-2 infection and should not be used as the sole basis for treatment or other patient management decisions. A negative result may occur with  improper specimen collection/handling, submission of specimen other than nasopharyngeal swab, presence of viral mutation(s) within the areas targeted by this assay, and inadequate number of viral copies(<138 copies/mL). A negative result must be combined with clinical observations, patient history, and epidemiological information. The expected result is Negative.  Fact Sheet for Patients:  11/17/21  Fact Sheet for Healthcare Providers:  11/16/21  This test is no t yet approved or cleared by the [employer] FDA and   has been authorized for detection and/or diagnosis of SARS-CoV-2 by FDA under an Emergency Use Authorization (EUA). This EUA will remain  in effect (meaning this test can be used) for the duration of the COVID-19 declaration under Section 564(b)(1) of the  Act, 21 U.S.C.section 360bbb-3(b)(1), unless the authorization is terminated  or revoked sooner.       Influenza A by PCR NEGATIVE NEGATIVE Final   Influenza B by PCR NEGATIVE NEGATIVE Final    Comment: (NOTE) The Xpert Xpress SARS-CoV-2/FLU/RSV plus assay is intended as an aid in the diagnosis of influenza from Nasopharyngeal swab specimens and should not be used as a sole basis for treatment. Nasal washings and aspirates are unacceptable for Xpert Xpress SARS-CoV-2/FLU/RSV testing.  Fact Sheet for Patients: [employer]com  Fact Sheet for Healthcare Providers: Serious[employer]ker.it  This test is not yet approved or cleared by the [place] FDA and has been authorized for detection and/or diagnosis of SARS-CoV-2 by FDA under an Emergency Use Authorization (EUA). This EUA will remain in effect (meaning this test can be used) for the duration of the COVID-19 declaration under Section 564(b)(1) of the Act, 21 U.S.C. section 360bbb-3(b)(1), unless the authorization is terminated or revoked.  Performed at [facility], [address] [place], [place] [address]   Blood culture (routine x 2)     Status: None (Preliminary result)   Collection Time: 11/14/21  4:20 PM   Specimen: BLOOD  Result Value Ref Range Status   Specimen Description   Final    BLOOD BLOOD RIGHT FOREARM Performed at [facility], [address] [place], [place] [address]    Special Requests   Final    BOTTLES DRAWN AEROBIC AND ANAEROBIC Blood Culture adequate volume Performed at [facility], [address] [place], [place] [address]    Culture   Final    NO GROWTH 3  DAYS Performed at [facility], [address] [place], [place] [address]    Report Status PENDING  Incomplete  Blood culture (routine x 2)     Status: None (Preliminary result)   Collection Time: 11/14/21  4:21 PM   Specimen: BLOOD  Result Value Ref Range Status   Specimen Description   Final    BLOOD BLOOD LEFT HAND Performed at [facility], [address] [place], [place] [address]    Special Requests   Final    BOTTLES DRAWN AEROBIC ONLY Blood Culture adequate volume Performed at [facility], [address] [place], [place] [address]    Culture   Final    NO GROWTH 3 DAYS Performed at [facility], [address] [place], [place] [address]    Report Status PENDING  Incomplete  MRSA Next Gen by PCR, Nasal     Status: None   Collection Time: 11/14/21  8:22 PM   Specimen: Nasal Mucosa; Nasal Swab  Result Value Ref Range Status   MRSA by PCR Next Gen NOT DETECTED NOT DETECTED Final    Comment: (NOTE) The GeneXpert MRSA Assay (FDA approved for NASAL specimens only), is one component of a comprehensive MRSA colonization surveillance program. It is not intended to diagnose MRSA infection nor to guide or monitor treatment for MRSA infections. Test performance is not FDA approved in patients less than 87 years old. Performed at [facility], [address], [place], [place] [address]       Radiology Studies: DG Knee 1-2 Views Left  Result Date: 11/15/2021 CLINICAL DATA:  Bilateral knee pain EXAM: LEFT KNEE - 1-2 VIEW COMPARISON:  None. 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 The hardware appears intact. No acute fracture  or dislocation identified. Small suprapatellar density could represent a small joint effusion. IMPRESSION: No acute osseous abnormality identified. Electronically Signed   By: Jannifer Hickelaney  Williams M.D.   On: 11/15/2021 13:06   DG Knee 1-2 Views Right  Result Date: 11/15/2021 CLINICAL  DATA:  Bilateral knee pain EXAM: RIGHT KNEE - 1-2 VIEW COMPARISON:  None. FINDINGS: No acute fracture or dislocation identified. Previous total arthroplasty surgical changes, the hardware appears intact. Suprapatellar density consistent with joint effusion. IMPRESSION: No acute osseous abnormality identified. Evidence of joint effusion. Electronically Signed   By: Jannifer Hickelaney  Williams M.D.   On: 11/15/2021 13:05    Scheduled Meds:  amLODipine  10 mg Oral Daily   busPIRone  10 mg Oral TID   enoxaparin (LOVENOX) injection  40 mg Subcutaneous Q24H   hydrochlorothiazide  25 mg Oral Daily   levETIRAcetam  500 mg Oral BID   melatonin  3 mg Oral QHS   metoprolol tartrate  25 mg Oral BID   pantoprazole (PROTONIX) IV  40 mg Intravenous Q12H   pregabalin  50 mg Oral TID   Continuous Infusions:  0.9 % NaCl with KCl 40 mEq / L 100 mL/hr at 11/16/21 2041     LOS: 3 days   Time spent: 25 minutes  Hughie Clossavi Nedda Gains, MD [facility]  11/17/2021, 10:54 AM  Please page via Loretha StaplerAmion and do not message via secure chat for urgent patient care matters. Secure chat can be used for non urgent patient care matters.  How to contact the [facility] Attending or Consulting provider 7A - 7P or covering provider during after hours 7P -7A, for this patient?  Check the care team in [facility] and look for a) attending/consulting [facility] provider listed and b) the [facility] team listed. Page or secure chat 7A-7P. Log into [email or web address] and use [place]'s universal password to access. If you do not have the password, please contact the hospital operator. Locate the [facility] provider you are looking for under [facility] and page to a number that you can be directly reached. If you still have difficulty reaching the provider, please page the [facility] (Director on Call) for the Hospitalists listed on amion for assistance.

## 2021-11-17 NOTE — Progress Notes (Signed)
Occupational Therapy Treatment Patient Details Name: Yolanda Keith MRN: [id number] DOB: 1951-06-07 Today's Date: 11/17/2021   History of present illness Pt is a 71 y.o. female admitted 11/14/21 with c/o RUQ pain, severe sepsis of unclear source. Abdominal CT with large gallstone. Pt also reports bilateral knee buckling; has h/o bilateral TKA. PMH includes HTN, HLD, arthritis, seizure disorder   OT comments  Pt much safer when she agrees to use RW. Educated in use of sock aid, pt did not think it would be helpful.    Recommendations for follow up therapy are one component of a multi-disciplinary discharge planning process, led by the attending physician.  Recommendations may be updated based on patient status, additional functional criteria and insurance authorization.    Follow Up Recommendations  Home health OT    Assistance Recommended at Discharge Frequent or constant Supervision/Assistance  Patient can return home with the following  A little help with walking and/or transfers;A little help with bathing/dressing/bathroom;Assistance with cooking/housework;Assist for transportation;Help with stairs or ramp for entrance   Equipment Recommendations  BSC/3in1    Recommendations for Other Services      Precautions / Restrictions Precautions Precautions: Fall;Other (comment) Precaution Comments: enteric       Mobility Bed Mobility Overal bed mobility: Independent             General bed mobility comments: returned to supine    Transfers Overall transfer level: Needs assistance Equipment used: Rolling walker (2 wheels) Transfers: Sit to/from Stand Sit to Stand: Min guard           General transfer comment: pt willing to use RW with encouragement     Balance Overall balance assessment: Needs assistance   Sitting balance-Leahy Scale: Good       Standing balance-Leahy Scale: Poor Standing balance comment: reliant on at least single UE support to stabilize                            ADL either performed or assessed with clinical judgement   ADL Overall ADL's : Needs assistance/impaired                 Upper Body Dressing : Set up;Sitting   Lower Body Dressing: Supervision/safety;Sitting/lateral leans Lower Body Dressing Details (indicate cue type and reason): demonstrated use of sock aid, pt stating it was more trouble than it was worth Toilet Transfer: Min guard;Rolling walker (2 wheels);Ambulation;Comfort height toilet   Toileting- Clothing Manipulation and Hygiene: Set up;Sitting/lateral lean       Functional mobility during ADLs: Min guard;Rolling walker (2 wheels)      Extremity/Trunk Assessment              Vision       Perception     Praxis      Cognition Arousal/Alertness: Awake/alert Behavior During Therapy: WFL for tasks assessed/performed Overall Cognitive Status: Within Functional Limits for tasks assessed                                          Exercises      Shoulder Instructions       General Comments      Pertinent Vitals/ Pain       Pain Assessment Pain Assessment: Faces Faces Pain Scale: Hurts little more Pain Location: knees Pain Descriptors / Indicators: Discomfort Pain Intervention(s): Monitored during session  Home Living                                          Prior Functioning/Environment              Frequency  Min 2X/week        Progress Toward Goals  OT Goals(current goals can now be found in the care plan section)  Progress towards OT goals: Progressing toward goals  Acute Rehab OT Goals OT Goal Formulation: With patient Time For Goal Achievement: 11/30/21 Potential to Achieve Goals: Good  Plan Discharge plan remains appropriate    Co-evaluation                 AM-PAC OT "6 Clicks" Daily Activity     Outcome Measure   Help from another person eating meals?: None Help from another person taking  care of personal grooming?: A Little Help from another person toileting, which includes using toliet, bedpan, or urinal?: A Little Help from another person bathing (including washing, rinsing, drying)?: A Little Help from another person to put on and taking off regular upper body clothing?: None Help from another person to put on and taking off regular lower body clothing?: A Little 6 Click Score: 20    End of Session Equipment Utilized During Treatment: Rolling walker (2 wheels);Gait belt  OT Visit Diagnosis: Unsteadiness on feet (R26.81);Other abnormalities of gait and mobility (R26.89);Pain;Muscle weakness (generalized) (M62.81)   Activity Tolerance Patient tolerated treatment well   Patient Left in bed;with call bell/phone within reach;with bed alarm set   Nurse Communication          Time: 1535-1550 OT Time Calculation (min): 15 min  Charges: OT General Charges $OT Visit: 1 Visit OT Treatments $Self Care/Home Management : 8-22 mins  Nestor Lewandowsky, OTR/L Acute Rehabilitation Services Pager: [phone] Office: [phone]   Malka So 11/17/2021, 3:53 PM

## 2021-11-17 NOTE — Care Management Important Message (Signed)
Important Message  Patient Details  Name: Miamor Ayler MRN: [id number] Date of Birth: 01-04-1951   Medicare Important Message Given:  Yes     Sherilyn Banker 11/17/2021, 1:11 PM

## 2021-11-18 LAB — BASIC METABOLIC PANEL
Anion gap: 10 (ref 5–15)
BUN: 9 mg/dL (ref 8–23)
CO2: 24 mmol/L (ref 22–32)
Calcium: 10.2 mg/dL (ref 8.9–10.3)
Chloride: 105 mmol/L (ref 98–111)
Creatinine, Ser: 0.98 mg/dL (ref 0.44–1.00)
GFR, Estimated: >60 mL/min (ref >60)
Glucose, Bld: 144 mg/dL — ABNORMAL HIGH (ref 70–99)
Potassium: 3.6 mmol/L (ref 3.5–5.1)
Sodium: 139 mmol/L (ref 135–145)

## 2021-11-18 MED ORDER — PANTOPRAZOLE SODIUM 40 MG PO TBEC: 40.0000 mg | DELAYED_RELEASE_TABLET | Freq: Two times a day (BID) | ORAL | Status: DC

## 2021-11-18 NOTE — Discharge Summary (Signed)
PatientPhysician Discharge Summary  Yolanda Keith ZOX:[id number] DOB: [birth year] DOA: 11/14/2021  PCP: Caffie DammeSmith, Karla, MD  Admit date: 11/14/2021 Discharge date: 11/18/2021 30 Day Unplanned Readmission Risk Score    Flowsheet Row ED to Hosp-Admission (Current) [facility]  [facility]URGICAL  30 Day Unplanned Readmission Risk Score (%) 16.72 Filed at 11/18/2021 0801       This score is the patient's risk of an unplanned readmission within 30 days of being discharged (0 -100%). The score is based on dignosis, age, lab data, medications, orders, and past utilization.   Low:  0-14.9   Medium: 15-21.9   High: 22-29.9   Extreme: 30 and above          Admitted From: Home  Disposition: Home  Recommendations for Outpatient Follow-up:  Follow up with PCP in 1-2 weeks Please obtain BMP/CBC in one week Please follow up with your PCP on the following pending results: Unresulted Labs (From admission, onward)     Start     Ordered   11/16/21 0810  C Difficile Quick Screen w PCR reflex  (C Difficile quick screen w PCR reflex panel )  Once, for 24 hours,   TIMED       References:    CDiff Information Tool   11/16/21 0809   11/15/21 1206  Urinalysis, Routine w reflex microscopic  Once,   R        11/15/21 1205              Home Health: Yes Equipment/Devices: 3 in 1 bedside commode  Discharge Condition: Stable CODE STATUS: Full code Diet recommendation: Cardiac  Subjective: Seen and examined.  She has no complaints.  No nausea, no abdominal pain or any diarrhea.  She is happy that she is going home today.  Brief/Interim Summary: Yolanda Muffancy Holland is a 71 y.o. female with medical history significant for GERD, essential hypertension, seizure disorder, who [facility]  complaining of right upper quadrant abdominal pain for 1 to 2 days which was nonradiating, sharp and associated with subjective fever, nausea vomiting but no diarrhea,  urinary complaints or chills.   Upon arrival to ED, her Temperature max 100.7; initial heart rate 125, oxygen saturation 99 to 100% on room air, but flasher 137/90. Potassium 3.1, Urinalysis showed no white blood cells, rare bacteria,.  Initial lactate 3.8,  COVID-19/influenza PCR negative blood cultures x2 collected prior to initiation of antibiotics.   Unremarkable chest x-ray, Noncontrast CT that showed no evidence of acute intracranial process, including no evidence of intracranial hemorrhage or any evidence of acute ischemic infarct.  CT abdomen/pelvis showed a 1.8 cm gallstone within the gallbladder itself, but in the absence of any evidence of pericholecystic fluid nor any evidence of gallbladder wall thickening, nor any evidence of common bile duct dilation and no evidence of choledocholithiasis.  Ultrasound abdomen confirmed cholelithiasis but ruled out cholecystitis.  She was started on IV fluids.  Kept n.p.o. in the beginning.  Gradually her symptoms improved with symptomatic treatment.  She was started on liquid diet and advance to regular diet and she has been tolerating without having any nausea since last 2 days.  Apparently patient did have 1 episode of diarrhea 2 days ago which prompted us to order C. difficile but since last 48 hours, patient has not had diarrhea at all.  C. difficile is not suspected at this point in time.  Also patient was taken off of antibiotics after 24  hours and she has remained afebrile for last 3 days.  Sepsis or infection is ruled out.  I think most likely her symptoms were secondary to gastritis.   Sacral fracture: Pain controlled.  She will have home health PT OT.   History of seizure: Continue Keppra.   Hypertension: Blood pressure controlled, continue amlodipine and hydrochlorothiazide.   Severe hypokalemia: Resolved   Hypomagnesemia: Resolved   Bilateral knee pain: Patient is complaining that both of her knees are popping out lately and she is hurting  as well.  On my examination, they appear normal.  Nontender with no swelling and no redness.  X-ray bilateral knee negative for any acute pathology.  Per PT note, she is actually buckling and not popping out.  PT OT recommends home health PT OT.   Insomnia: This is a chronic issue for her.  Resume home medications.  She is on Seroquel as well as melatonin.  Discharge plan was discussed with patient and/or family member and they verbalized understanding and agreed with it.  Discharge Diagnoses:  Principal Problem:   Severe sepsis (HCC) Active Problems:   Nausea & vomiting   Right upper quadrant abdominal pain   Hypokalemia   Lactic acidosis   GERD (gastroesophageal reflux disease)   Hypertension    Discharge Instructions   Allergies as of 11/18/2021       Reactions   Penicillins Hives   Latex Rash   Sulfa Antibiotics Itching      Zinc    Shakes, restlessness        Medication List     STOP taking these medications    HYDROcodone-acetaminophen 5-325 MG tablet Commonly known as: NORCO/VICODIN       TAKE these medications    amLODipine 10 MG tablet Commonly known as: NORVASC Take 10 mg by mouth in the morning.   aspirin 81 MG chewable tablet Chew 81 mg by mouth in the morning.   atorvastatin 20 MG tablet Commonly known as: LIPITOR Take 20 mg by mouth every evening.   busPIRone 10 MG tablet Commonly known as: BUSPAR Take 20 mg by mouth 2 (two) times daily.   desvenlafaxine 50 MG 24 hr tablet Commonly known as: PRISTIQ Take 50 mg by mouth in the morning.   docusate sodium 100 MG capsule Commonly known as: COLACE Take 100 mg by mouth 2 (two) times daily.   hydrochlorothiazide 25 MG tablet Commonly known as: HYDRODIURIL Take 25 mg by mouth in the morning.   hydrOXYzine 25 MG tablet Commonly known as: ATARAX Take 25 mg by mouth at bedtime.   levETIRAcetam 500 MG tablet Commonly known as: KEPPRA Take 500 mg by mouth 2 (two) times daily.    melatonin 3 MG Tabs tablet Take 6 mg by mouth at bedtime.   metoprolol tartrate 25 MG tablet Commonly known as: LOPRESSOR Take 25 mg by mouth 2 (two) times daily.   omeprazole 40 MG capsule Commonly known as: PRILOSEC Take 40 mg by mouth in the morning and at bedtime.   potassium chloride 20 MEQ packet Commonly known as: KLOR-CON Take 20 mEq by mouth 2 (two) times daily.   pregabalin 50 MG capsule Commonly known as: LYRICA Take 50 mg by mouth 3 (three) times daily.   QUEtiapine 100 MG tablet Commonly known as: SEROQUEL Take 100 mg by mouth at bedtime.               Durable Medical Equipment  (From admission, onward)  Start     Ordered   11/16/21 1327  For home use only DME 3 n 1  Once        11/16/21 1327            Follow-up Information     [facility] Follow up.   Specialty: Home Health Services Contact information: [address] [place] [address] [phone]         Caffie Damme, MD Follow up in 1 week(s).   Specialty: Family Medicine Contact information: [address] [place] [address] [phone]                Allergies  Allergen Reactions   Penicillins Hives   Latex Rash   Sulfa Antibiotics Itching        Zinc     Shakes, restlessness    Consultations: None   Procedures/Studies: DG Sacrum/Coccyx  Result Date: 11/12/2021 CLINICAL DATA:  Fall several days ago with sacral pain, initial encounter EXAM: SACRUM AND COCCYX - 2+ VIEW COMPARISON:  None. FINDINGS: Pelvic ring as visualized is within normal limits. Sacral ala are unremarkable. Some angulation of the anterior margin of the sacrum is noted distally suspicious for mildly angulated fracture. No soft tissue changes are seen. IMPRESSION: Angulation in the sacrum consistent with sacral fracture. Electronically Signed   By: Alcide Clever M.D.   On: 11/12/2021 19:55   DG Elbow Complete Right  Result Date:  11/12/2021 CLINICAL DATA:  Fall 2 days ago, elbow pain EXAM: RIGHT ELBOW - COMPLETE 3+ VIEW COMPARISON:  None. FINDINGS: There is no evidence of acute fracture or dislocation. Elbow alignment is maintained. The joint spaces are preserved. The soft tissues are unremarkable. There is no effusion. IMPRESSION: No acute fracture or dislocation. Electronically Signed   By: Lesia Hausen M.D.   On: 11/12/2021 19:51   DG Knee 1-2 Views Left  Result Date: 11/15/2021 CLINICAL DATA:  Bilateral knee pain EXAM: LEFT KNEE - 1-2 VIEW COMPARISON:  None. 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 The hardware appears intact. No acute fracture or dislocation identified. Small suprapatellar density could represent a small joint effusion. IMPRESSION: No acute osseous abnormality identified. Electronically Signed   By: Jannifer Hick M.D.   On: 11/15/2021 13:06   DG Knee 1-2 Views Right  Result Date: 11/15/2021 CLINICAL DATA:  Bilateral knee pain EXAM: RIGHT KNEE - 1-2 VIEW COMPARISON:  None. FINDINGS: No acute fracture or dislocation identified. Previous total arthroplasty surgical changes, the hardware appears intact. Suprapatellar density consistent with joint effusion. IMPRESSION: No acute osseous abnormality identified. Evidence of joint effusion. Electronically Signed   By: Jannifer Hick M.D.   On: 11/15/2021 13:05   CT Head Wo Contrast  Result Date: 11/14/2021 CLINICAL DATA:  Acute neuro deficit. Stroke suspected. Vomiting and chills starting last night. Seen in ER for recent fall 11/12/2021. EXAM: CT HEAD WITHOUT CONTRAST TECHNIQUE: Contiguous axial images were obtained from the base of the skull through the vertex without intravenous contrast. RADIATION DOSE REDUCTION: This exam was performed according to the departmental dose-optimization program which includes automated exposure control, adjustment of the mA and/or kV according to patient size and/or use of iterative  reconstruction technique. COMPARISON:  None. FINDINGS: Brain: The ventricles are normal in size and configuration. The basilar cisterns are patent. No mass, mass effect, or midline shift. No acute intracranial hemorrhage is seen. No abnormal extra-axial fluid collection. Preservation of the normal cortical gray-white interface without  CT evidence of an acute major vascular territorial cortical based infarction. Vascular: No hyperdense vessel or unexpected calcification. Skull: Normal. Negative for fracture or focal lesion. Sinuses/Orbits: The visualized orbits are unremarkable. The visualized paranasal sinuses and mastoid air cells are clear. Other: None. IMPRESSION: No acute intracranial process. Electronically Signed   By: Neita Garnetonald  Viola M.D.   On: 11/14/2021 17:18   CT ABDOMEN PELVIS W CONTRAST  Result Date: 11/14/2021 CLINICAL DATA:  Right lower quadrant abdominal pain. Vomiting and chills. EXAM: CT ABDOMEN AND PELVIS WITH CONTRAST TECHNIQUE: Multidetector CT imaging of the abdomen and pelvis was performed using the standard protocol following bolus administration of intravenous contrast. RADIATION DOSE REDUCTION: This exam was performed according to the departmental dose-optimization program which includes automated exposure control, adjustment of the mA and/or kV according to patient size and/or use of iterative reconstruction technique. CONTRAST:  100mL OMNIPAQUE IOHEXOL 300 MG/ML  SOLN COMPARISON:  None. FINDINGS: Lower chest: Small type 1 hiatal hernia. Hepatobiliary: Faint rim calcified gallstone measuring 1.8 cm noted dependently in the gallbladder. No biliary dilatation. Mild focal steatosis in segment 4 of the liver adjacent to the falciform ligament. Pancreas: Unremarkable Spleen: Unremarkable Adrenals/Urinary Tract: 6 by 9 mm hypodense lesion medially along the left kidney upper pole is likely a cyst although technically too small to characterize. Adrenal glands unremarkable. Contrast medium in the  collecting systems on the initial images, presumably due to some kind of test injection, which reduces sensitivity for nonobstructive calculi. No hydronephrosis or hydroureter. Nondistended urinary bladder. Stomach/Bowel: Scattered sigmoid colon diverticula. Appendix surgically absent. Vascular/Lymphatic: Atherosclerosis is present, including aortoiliac atherosclerotic disease. Reproductive: Uterus absent.  Adnexa unremarkable. Other: No supplemental non-categorized findings. Musculoskeletal: Lumbar spondylosis and degenerative disc disease causing substantial bilateral foraminal impingement L3-4, L4-5, and L5-S1. The previous radiographic findings suspicious for sacral fracture are less convincing on today's CT examination. IMPRESSION: 1. A cause for the patient's right lower quadrant abdominal pain is not identified. There is no dilated bowel. The appendix is surgically absent. 2. 1.8 gallstone in the gallbladder noted but without pericholecystic fluid or gallbladder wall thickening. 3. There are findings suspicious for sacral fracture on the recent conventional radiographs, although the appearance is less suspicious on today's CT. 4. Atherosclerosis is present, including aortoiliac atherosclerotic disease. 5. Substantial foraminal impingement at L3-4, L4-5, and L5-S1. 6. Small type 1 hiatal hernia. Electronically Signed   By: Gaylyn RongWalter  Liebkemann M.D.   On: 11/14/2021 17:23   DG Chest Port 1 View  Result Date: 11/14/2021 CLINICAL DATA:  Questionable sepsis. EXAM: PORTABLE CHEST 1 VIEW COMPARISON:  None. FINDINGS: The heart size and mediastinal contours are within normal limits. Both lungs are clear. The visualized skeletal structures are unremarkable. IMPRESSION: No active disease. Electronically Signed   By: Darliss CheneyAmy  Guttmann M.D.   On: 11/14/2021 17:41   US Abdomen Limited RUQ (LIVER/GB)  Result Date: 11/14/2021 CLINICAL DATA:  Abdominal pain EXAM: ULTRASOUND ABDOMEN LIMITED RIGHT UPPER QUADRANT COMPARISON:   11/14/2021 CT abdomen pelvis FINDINGS: Gallbladder: Large peripherally calcified stone within the gallbladder, which measures up to 2.1 cm. No wall thickening. No pericholecystic fluid. No sonographic Murphy sign noted by sonographer. Common bile duct: Diameter: 5 mm Liver: No focal lesion identified. Increased parenchymal echogenicity. Portal vein is patent on color Doppler imaging with normal direction of blood flow towards the liver. Other: None. IMPRESSION: 1. Cholelithiasis without evidence of cholecystitis. 2. Hepatic steatosis. Electronically Signed   By: Wiliam KeAlison  Vasan M.D.   On: 11/14/2021 19:35  Discharge Exam: Vitals:   11/17/21 2250 11/18/21 0514  BP: (!) 129/110 (!) 104/92  Pulse: 84 81  Resp: 19 17  Temp: 98.2 F (36.8 C) 98.4 F (36.9 C)  SpO2: 100% 100%   Vitals:   11/17/21 1557 11/17/21 2250 11/18/21 0500 11/18/21 0514  BP: (!) 154/101 (!) 129/110  (!) 104/92  Pulse: 91 84  81  Resp: 19 19  17   Temp: 99.2 F (37.3 C) 98.2 F (36.8 C)  98.4 F (36.9 C)  TempSrc: Oral Oral  Oral  SpO2: 98% 100%  100%  Weight:   80 kg   Height:        General: Pt is alert, awake, not in acute distress Cardiovascular: RRR, S1/S2 +, no rubs, no gallops Respiratory: CTA bilaterally, no wheezing, no rhonchi Abdominal: Soft, NT, ND, bowel sounds + Extremities: no edema, no cyanosis    The results of significant diagnostics from this hospitalization (including imaging, microbiology, ancillary and laboratory) are listed below for reference.     Microbiology: Recent Results (from the past 240 hour(s))  Resp Panel by RT-PCR (Flu A&B, Covid) Nasopharyngeal Swab     Status: None   Collection Time: 11/14/21  4:00 PM   Specimen: Nasopharyngeal Swab; Nasopharyngeal(NP) swabs in vial transport medium  Result Value Ref Range Status   SARS Coronavirus 2 by RT PCR NEGATIVE NEGATIVE Final    Comment: (NOTE) SARS-CoV-2 target nucleic acids are NOT DETECTED.  The SARS-CoV-2 RNA is  generally detectable in upper respiratory specimens during the acute phase of infection. The lowest concentration of SARS-CoV-2 viral copies this assay can detect is 138 copies/mL. A negative result does not preclude SARS-Cov-2 infection and should not be used as the sole basis for treatment or other patient management decisions. A negative result may occur with  improper specimen collection/handling, submission of specimen other than nasopharyngeal swab, presence of viral mutation(s) within the areas targeted by this assay, and inadequate number of viral copies(<138 copies/mL). A negative result must be combined with clinical observations, patient history, and epidemiological information. The expected result is Negative.  Fact Sheet for Patients:  BloggerCourse.com  Fact Sheet for Healthcare Providers:  SeriousBroker.it  This test is no t yet approved or cleared by the [place] FDA and  has been authorized for detection and/or diagnosis of SARS-CoV-2 by FDA under an Emergency Use Authorization (EUA). This EUA will remain  in effect (meaning this test can be used) for the duration of the COVID-19 declaration under Section 564(b)(1) of the Act, 21 U.S.C.section 360bbb-3(b)(1), unless the authorization is terminated  or revoked sooner.       Influenza A by PCR NEGATIVE NEGATIVE Final   Influenza B by PCR NEGATIVE NEGATIVE Final    Comment: (NOTE) The Xpert Xpress SARS-CoV-2/FLU/RSV plus assay is intended as an aid in the diagnosis of influenza from Nasopharyngeal swab specimens and should not be used as a sole basis for treatment. Nasal washings and aspirates are unacceptable for Xpert Xpress SARS-CoV-2/FLU/RSV testing.  Fact Sheet for Patients: BloggerCourse.com  Fact Sheet for Healthcare Providers: SeriousBroker.it  This test is not yet approved or cleared by the [place] FDA and has been authorized for detection and/or diagnosis of SARS-CoV-2 by FDA under an Emergency Use Authorization (EUA). This EUA will remain in effect (meaning this test can be used) for the duration of the COVID-19 declaration under Section 564(b)(1) of the Act, 21 U.S.C. section 360bbb-3(b)(1), unless the authorization is terminated or revoked.  Performed at [facility], [address] [place], [place] [address]   Blood culture (routine x 2)     Status: None (Preliminary result)   Collection Time: 11/14/21  4:20 PM   Specimen: BLOOD  Result Value Ref Range Status   Specimen Description   Final    BLOOD BLOOD RIGHT FOREARM Performed at [facility], [address] [place], [place] [address]    Special Requests   Final    BOTTLES DRAWN AEROBIC AND ANAEROBIC Blood Culture adequate volume Performed at [facility], [address] [place], [place] [address]    Culture   Final    NO GROWTH 3 DAYS Performed at [facility], [address] [place], [place] [address]    Report Status PENDING  Incomplete  Blood culture (routine x 2)     Status: None (Preliminary result)   Collection Time: 11/14/21  4:21 PM   Specimen: BLOOD  Result Value Ref Range Status   Specimen Description   Final    BLOOD BLOOD LEFT HAND Performed at [facility], [address] [place], [place] [address]    Special Requests   Final    BOTTLES DRAWN AEROBIC ONLY Blood Culture adequate volume Performed at [facility], [address] [place], [place] [address]    Culture   Final    NO GROWTH 3 DAYS Performed at [facility], [address], [place], [place] [address]    Report Status PENDING  Incomplete  MRSA Next Gen by PCR, Nasal     Status: None   Collection Time: 11/14/21  8:22 PM   Specimen: Nasal Mucosa; Nasal Swab  Result Value Ref Range Status   MRSA by PCR Next Gen NOT DETECTED NOT DETECTED Final    Comment:  (NOTE) The GeneXpert MRSA Assay (FDA approved for NASAL specimens only), is one component of a comprehensive MRSA colonization surveillance program. It is not intended to diagnose MRSA infection nor to guide or monitor treatment for MRSA infections. Test performance is not FDA approved in patients less than 87 years old. Performed at [facility], [address] [place], [place] [address]      Labs: BNP (last 3 results) No results for input(s): BNP in the last 8760 hours. Basic Metabolic Panel: Recent Labs  Lab 11/14/21 1418 11/15/21 0052 11/16/21 0147 11/17/21 0921 11/18/21 0110  NA 141 140 141 137 139  K 3.1* 2.4* 4.0 4.1 3.6  CL 96* 99 108 107 105  CO2 30 28 23  21* 24  GLUCOSE 137* 123* 97 104* 144*  BUN 12 7* 9 11 9   CREATININE 0.93 0.87 0.91 0.83 0.98  CALCIUM 10.9* 9.8 9.8 9.7 10.2  MG  --  1.6* 2.1  --   --    Liver Function Tests: Recent Labs  Lab 11/14/21 1418 11/15/21 0052 11/16/21 0147  AST 36 40 49*  ALT 21 20 22   ALKPHOS 88 75 69  BILITOT 0.6 0.5 0.8  PROT 8.6* 7.5 6.4*  ALBUMIN 5.3* 4.5 4.0   Recent Labs  Lab 11/14/21 1418  LIPASE 27   Recent Labs  Lab 11/14/21 1621  AMMONIA 25   CBC: Recent Labs  Lab 11/14/21 1418 11/15/21 0052 11/16/21 0147  WBC 10.0 11.0* 8.1  NEUTROABS  --  7.3 4.5  HGB 14.5 13.1 13.3  HCT 44.1 38.9 40.2  MCV 83.8 82.9 84.6  PLT 250  205 204   Cardiac Enzymes: Recent Labs  Lab 11/15/21 0052  CKTOTAL 868*   BNP: Invalid input(s): POCBNP CBG: Recent Labs  Lab 11/14/21 1613 11/14/21 1739  GLUCAP 167* 116*   D-Dimer No results for input(s): DDIMER in the last 72 hours. Hgb A1c No results for input(s): HGBA1C in the last 72 hours. Lipid Profile No results for input(s): CHOL, HDL, LDLCALC, TRIG, CHOLHDL, LDLDIRECT in the last 72 hours. Thyroid function studies No results for input(s): TSH, T4TOTAL, T3FREE, THYROIDAB in the last 72 hours.  Invalid input(s): FREET3 Anemia work up No results  for input(s): VITAMINB12, FOLATE, FERRITIN, TIBC, IRON, RETICCTPCT in the last 72 hours. Urinalysis    Component Value Date/Time   COLORURINE YELLOW 11/14/2021 1621   APPEARANCEUR HAZY (A) 11/14/2021 1621   LABSPEC 1.020 11/14/2021 1621   PHURINE 8.5 (H) 11/14/2021 1621   GLUCOSEU NEGATIVE 11/14/2021 1621   HGBUR TRACE (A) 11/14/2021 1621   BILIRUBINUR NEGATIVE 11/14/2021 1621   KETONESUR NEGATIVE 11/14/2021 1621   PROTEINUR 100 (A) 11/14/2021 1621   NITRITE NEGATIVE 11/14/2021 1621   LEUKOCYTESUR NEGATIVE 11/14/2021 1621   Sepsis Labs Invalid input(s): PROCALCITONIN,  WBC,  LACTICIDVEN Microbiology Recent Results (from the past 240 hour(s))  Resp Panel by RT-PCR (Flu A&B, Covid) Nasopharyngeal Swab     Status: None   Collection Time: 11/14/21  4:00 PM   Specimen: Nasopharyngeal Swab; Nasopharyngeal(NP) swabs in vial transport medium  Result Value Ref Range Status   SARS Coronavirus 2 by RT PCR NEGATIVE NEGATIVE Final    Comment: (NOTE) SARS-CoV-2 target nucleic acids are NOT DETECTED.  The SARS-CoV-2 RNA is generally detectable in upper respiratory specimens during the acute phase of infection. The lowest concentration of SARS-CoV-2 viral copies this assay can detect is 138 copies/mL. A negative result does not preclude SARS-Cov-2 infection and should not be used as the sole basis for treatment or other patient management decisions. A negative result may occur with  improper specimen collection/handling, submission of specimen other than nasopharyngeal swab, presence of viral mutation(s) within the areas targeted by this assay, and inadequate number of viral copies(<138 copies/mL). A negative result must be combined with clinical observations, patient history, and epidemiological information. The expected result is Negative.  Fact Sheet for Patients:  BloggerCourse.com  Fact Sheet for Healthcare Providers:   SeriousBroker.it  This test is no t yet approved or cleared by the [place] FDA and  has been authorized for detection and/or diagnosis of SARS-CoV-2 by FDA under an Emergency Use Authorization (EUA). This EUA will remain  in effect (meaning this test can be used) for the duration of the COVID-19 declaration under Section 564(b)(1) of the Act, 21 U.S.C.section 360bbb-3(b)(1), unless the authorization is terminated  or revoked sooner.       Influenza A by PCR NEGATIVE NEGATIVE Final   Influenza B by PCR NEGATIVE NEGATIVE Final    Comment: (NOTE) The Xpert Xpress SARS-CoV-2/FLU/RSV plus assay is intended as an aid in the diagnosis of influenza from Nasopharyngeal swab specimens and should not be used as a sole basis for treatment. Nasal washings and aspirates are unacceptable for Xpert Xpress SARS-CoV-2/FLU/RSV testing.  Fact Sheet for Patients: BloggerCourse.com  Fact Sheet for Healthcare Providers: [email or web address]  This test is not yet approved or cleared by the [place] FDA and has been authorized for detection and/or diagnosis of SARS-CoV-2 by FDA under an Emergency Use Authorization (EUA). This EUA will remain in effect (meaning this test can be used) for the  duration of the COVID-19 declaration under Section 564(b)(1) of the Act, 21 U.S.C. section 360bbb-3(b)(1), unless the authorization is terminated or revoked.  Performed at [facility], [address] [place], [place] [address]   Blood culture (routine x 2)     Status: None (Preliminary result)   Collection Time: 11/14/21  4:20 PM   Specimen: BLOOD  Result Value Ref Range Status   Specimen Description   Final    BLOOD BLOOD RIGHT FOREARM Performed at [facility], [address] [place], [place] [address]    Special Requests   Final    BOTTLES DRAWN AEROBIC AND ANAEROBIC Blood Culture adequate  volume Performed at [facility], [address] [place], [place] [address]    Culture   Final    NO GROWTH 3 DAYS Performed at [facility], [address] [place], [place] [address]    Report Status PENDING  Incomplete  Blood culture (routine x 2)     Status: None (Preliminary result)   Collection Time: 11/14/21  4:21 PM   Specimen: BLOOD  Result Value Ref Range Status   Specimen Description   Final    BLOOD BLOOD LEFT HAND Performed at [facility], [address] [place], [place] [address]    Special Requests   Final    BOTTLES DRAWN AEROBIC ONLY Blood Culture adequate volume Performed at [facility], [address] [place], [place] [address]    Culture   Final    NO GROWTH 3 DAYS Performed at [facility], [address] [place], [place] [address]    Report Status PENDING  Incomplete  MRSA Next Gen by PCR, Nasal     Status: None   Collection Time: 11/14/21  8:22 PM   Specimen: Nasal Mucosa; Nasal Swab  Result Value Ref Range Status   MRSA by PCR Next Gen NOT DETECTED NOT DETECTED Final    Comment: (NOTE) The GeneXpert MRSA Assay (FDA approved for NASAL specimens only), is one component of a comprehensive MRSA colonization surveillance program. It is not intended to diagnose MRSA infection nor to guide or monitor treatment for MRSA infections. Test performance is not FDA approved in patients less than 37 years old. Performed at [facility], [address], [place], [place] [address]      Time coordinating discharge: Over 30 minutes  SIGNED:   Hughie Closs, MD  [facility] 11/18/2021, 9:49 AM  If 7PM-7AM, please contact night-coverage [email or web address]

## 2021-11-18 NOTE — TOC Transition Note (Addendum)
Transition of Care [facility]) - CM/SW Discharge Note   Patient Details  Name: Yolanda Keith MRN: [id number] Date of Birth: Jan 15, 1951  Transition of Care [facility]) CM/SW Contact:  Kingsley Plan, RN Phone Number: 11/18/2021, 8:52 AM   Clinical Narrative:     Rondel Jumbo of discharge today. 3 in 1 was already delivered to room   1140 walker ordered with Velna Hatchet with [employer]. Dan Humphreys will be delivered to patient's room today prior to discharge.   Transition of Care [facility]) Screening Note   Patient Details  Name: Yolanda Keith Date of Birth: 16-Dec-1950     Transition of Care Department [facility]) has reviewed patient and no TOC needs have been identified at this time. We will continue to monitor patient advancement through interdisciplinary progression rounds. If new patient transition needs arise, please place a TOC consult.    Final next level of care: Home w Home Health Services Barriers to Discharge: Continued Medical Work up   Patient Goals and CMS Choice Patient states their goals for this hospitalization and ongoing recovery are:: to return to home CMS Medicare.gov Compare Post Acute Care list provided to:: Patient Choice offered to / list presented to : Patient  Discharge Placement                       Discharge Plan and Services   Discharge Planning Services: CM Consult Post Acute Care Choice: Home Health          DME Arranged: N/A DME Agency: NA       HH Arranged: PT, OT          Social Determinants of Health (SDOH) Interventions     Readmission Risk Interventions No flowsheet data found.

## 2021-11-18 NOTE — Progress Notes (Signed)
Physical Therapy Treatment Patient Details Name: Yolanda Keith MRN: [id number] DOB: 01/14/1951 Today's Date: 11/18/2021   History of Present Illness Pt is a 70 y.o. female admitted 11/14/21 with c/o RUQ pain, severe sepsis of unclear source. Abdominal CT with large gallstone. Pt also reports bilateral knee buckling; has h/o bilateral TKA. PMH includes HTN, HLD, arthritis, seizure disorder    PT Comments    Pt received in supine, eager and agreeable to session with good tolerance for ambulation and fair tolerance for initiation of stair training. Pt able to ascend/descend 3 stairs with right rail wit up to +2 min assist for safety and line management. Pt somewhat fearful on descent but improved with encouragement and "down with the bad" cueing. Pt continues to ambulate with flexed posture due to complaints of bilateral knee pain, but improved with cues and task practice. Pt continues to benefit from skilled PT services to progress toward functional mobility goals. Anticipate safe discharge with family assistance once medically cleared, will follow acutely.     Recommendations for follow up therapy are one component of a multi-disciplinary discharge planning process, led by the attending physician.  Recommendations may be updated based on patient status, additional functional criteria and insurance authorization.  Follow Up Recommendations  Home health PT     Assistance Recommended at Discharge Intermittent Supervision/Assistance  Patient can return home with the following A little help with walking and/or transfers;A little help with bathing/dressing/bathroom;Assistance with cooking/housework;Assist for transportation;Help with stairs or ramp for entrance   Equipment Recommendations  BSC/3in1    Recommendations for Other Services       Precautions / Restrictions Precautions Precautions: Fall;Other (comment) Precaution Comments: enteric Restrictions Weight Bearing Restrictions: No      Mobility  Bed Mobility Overal bed mobility: Independent Bed Mobility: Supine to Sit     Supine to sit: Supervision     General bed mobility comments: increased time to come to EOB    Transfers Overall transfer level: Needs assistance Equipment used: Rolling walker (2 wheels) Transfers: Sit to/from Stand Sit to Stand: Min guard           General transfer comment: cues for safe hand placement    Ambulation/Gait Ambulation/Gait assistance: Min guard Gait Distance (Feet): 75 Feet Assistive device: Rolling walker (2 wheels) Gait Pattern/deviations: Step-to pattern, Step-through pattern, Decreased stride length, Trunk flexed, Antalgic Gait velocity: Decreased     General Gait Details: slow antaglic gait, flexed posture throughout, cues needed for RW proximity and upright posture. mild knee buckling thgouhout but able to self correct.   Stairs Stairs: Yes Stairs assistance: Min assist, +2 safety/equipment Stair Management: One rail Right, Step to pattern, Forwards Number of Stairs: 3 General stair comments: cues needed for proper sequence "up with the good, down with the bad", increased time needed to descend secondary to pt being fearful, completed with encouragement.   Wheelchair Mobility    Modified Rankin (Stroke Patients Only)       Balance Overall balance assessment: Needs assistance Sitting-balance support: No upper extremity supported Sitting balance-Leahy Scale: Good     Standing balance support: Bilateral upper extremity supported Standing balance-Leahy Scale: Poor Standing balance comment: reliant on at B UE support to stabilize with RW                            Cognition Arousal/Alertness: Awake/alert Behavior During Therapy: WFL for tasks assessed/performed Overall Cognitive Status: Within Functional Limits for tasks assessed  Exercises      General Comments         Pertinent Vitals/Pain Pain Assessment Pain Assessment: Faces Faces Pain Scale: Hurts a little bit Pain Descriptors / Indicators: Discomfort Pain Intervention(s): Limited activity within patient's tolerance, Monitored during session, Repositioned    Home Living Family/patient expects to be discharged to:: Private residence Living Arrangements: Alone Available Help at Discharge: Family;Available 24 hours/day Type of Home: House Home Access: Stairs to enter Entrance Stairs-Rails: None Entrance Stairs-Number of Steps: 4   Home Layout: One level Home Equipment: Agricultural consultant (2 wheels);Cane - single point;Wheelchair - power;Shower seat Additional Comments: pt reports having a lot of assist of family as needed    Prior Function            PT Goals (current goals can now be found in the care plan section) Acute Rehab PT Goals Patient Stated Goal: Return home to dog "Queen" PT Goal Formulation: With patient Time For Goal Achievement: 11/30/21    Frequency    Min 3X/week      PT Plan      Co-evaluation              AM-PAC PT "6 Clicks" Mobility   Outcome Measure  Help needed turning from your back to your side while in a flat bed without using bedrails?: None Help needed moving from lying on your back to sitting on the side of a flat bed without using bedrails?: None Help needed moving to and from a bed to a chair (including a wheelchair)?: A Little Help needed standing up from a chair using your arms (e.g., wheelchair or bedside chair)?: A Little Help needed to walk in hospital room?: A Little Help needed climbing 3-5 steps with a railing? : A Lot 6 Click Score: 19    End of Session Equipment Utilized During Treatment: Gait belt Activity Tolerance: Patient tolerated treatment well;Patient limited by fatigue Patient left: in chair;with call bell/phone within reach;with chair alarm set Nurse Communication: Mobility status PT Visit Diagnosis: Other  abnormalities of gait and mobility (R26.89);Muscle weakness (generalized) (M62.81)     Time: 8453-6468 PT Time Calculation (min) (ACUTE ONLY): 24 min  Charges:  $Gait Training: 23-37 mins                     Bentley Fissel R. PTA Acute Rehabilitation Office: [phone]    Catalina Antigua 11/18/2021, 10:26 AM

## 2021-11-18 NOTE — Progress Notes (Signed)
Nursing DC note  Patient alert and oriented.  Verbalized understanding of dc instructions. All belongings and dc papers given to patient. Awaiting for walker and daughter Yolanda Keith is coming to take patient home. Patient is on  contact isolation and not appropriate for dc lounge

## 2021-11-19 LAB — CULTURE, BLOOD (ROUTINE X 2)
Culture: NO GROWTH
Culture: NO GROWTH
Special Requests: ADEQUATE
Special Requests: ADEQUATE

## 2022-01-17 ENCOUNTER — Other Ambulatory Visit: Payer: Self-pay

## 2022-01-17 ENCOUNTER — Encounter (HOSPITAL_BASED_OUTPATIENT_CLINIC_OR_DEPARTMENT_OTHER): Payer: Self-pay | Admitting: Emergency Medicine

## 2022-01-17 ENCOUNTER — Emergency Department (HOSPITAL_BASED_OUTPATIENT_CLINIC_OR_DEPARTMENT_OTHER)
Admission: EM | Admit: 2022-01-17 | Discharge: 2022-01-17 | Disposition: A | Discharge status: Home / Self Care | Payer: 59 | Attending: Emergency Medicine | Admitting: Emergency Medicine

## 2022-01-17 ENCOUNTER — Emergency Department (HOSPITAL_BASED_OUTPATIENT_CLINIC_OR_DEPARTMENT_OTHER): Payer: 59

## 2022-01-17 DIAGNOSIS — E876 Hypokalemia: Secondary | ICD-10-CM | POA: Diagnosis not present

## 2022-01-17 DIAGNOSIS — I10 Essential (primary) hypertension: Secondary | ICD-10-CM | POA: Diagnosis not present

## 2022-01-17 DIAGNOSIS — Z91040 Latex allergy status: Secondary | ICD-10-CM | POA: Insufficient documentation

## 2022-01-17 DIAGNOSIS — Z79899 Other long term (current) drug therapy: Secondary | ICD-10-CM | POA: Diagnosis not present

## 2022-01-17 DIAGNOSIS — Z7982 Long term (current) use of aspirin: Secondary | ICD-10-CM | POA: Diagnosis not present

## 2022-01-17 DIAGNOSIS — Z20822 Contact with and (suspected) exposure to covid-19: Secondary | ICD-10-CM | POA: Diagnosis not present

## 2022-01-17 DIAGNOSIS — R1032 Left lower quadrant pain: Secondary | ICD-10-CM | POA: Diagnosis present

## 2022-01-17 LAB — CBC WITH DIFFERENTIAL/PLATELET
Abs Immature Granulocytes: 0.05 10*3/uL (ref 0.00–0.07)
Basophils Absolute: 0.1 10*3/uL (ref 0.0–0.1)
Basophils Relative: 1 %
Eosinophils Absolute: 0 10*3/uL (ref 0.0–0.5)
Eosinophils Relative: 0 %
HCT: 40.3 % (ref 36.0–46.0)
Hemoglobin: 13.4 g/dL (ref 12.0–15.0)
Immature Granulocytes: 1 %
Lymphocytes Relative: 18 %
Lymphs Abs: 1.7 10*3/uL (ref 0.7–4.0)
MCH: 27.8 pg (ref 26.0–34.0)
MCHC: 33.3 g/dL (ref 30.0–36.0)
MCV: 83.6 fL (ref 80.0–100.0)
Monocytes Absolute: 0.3 10*3/uL (ref 0.1–1.0)
Monocytes Relative: 3 %
Neutro Abs: 7.1 10*3/uL (ref 1.7–7.7)
Neutrophils Relative %: 77 %
Platelets: 278 10*3/uL (ref 150–400)
RBC: 4.82 MIL/uL (ref 3.87–5.11)
RDW: 13.8 % (ref 11.5–15.5)
WBC: 9.2 10*3/uL (ref 4.0–10.5)
nRBC: 0 % (ref 0.0–0.2)

## 2022-01-17 LAB — COMPREHENSIVE METABOLIC PANEL
ALT: 18 U/L (ref 0–44)
AST: 29 U/L (ref 15–41)
Albumin: 4.7 g/dL (ref 3.5–5.0)
Alkaline Phosphatase: 91 U/L (ref 38–126)
Anion gap: 15 (ref 5–15)
BUN: 9 mg/dL (ref 8–23)
CO2: 22 mmol/L (ref 22–32)
Calcium: 10.1 mg/dL (ref 8.9–10.3)
Chloride: 104 mmol/L (ref 98–111)
Creatinine, Ser: 0.8 mg/dL (ref 0.44–1.00)
GFR, Estimated: >60 mL/min (ref >60)
Glucose, Bld: 133 mg/dL — ABNORMAL HIGH (ref 70–99)
Potassium: 2.8 mmol/L — ABNORMAL LOW (ref 3.5–5.1)
Sodium: 141 mmol/L (ref 135–145)
Total Bilirubin: 0.4 mg/dL (ref 0.3–1.2)
Total Protein: 8.2 g/dL — ABNORMAL HIGH (ref 6.5–8.1)

## 2022-01-17 LAB — RESP PANEL BY RT-PCR (FLU A&B, COVID) ARPGX2
Influenza A by PCR: NEGATIVE
Influenza B by PCR: NEGATIVE
SARS Coronavirus 2 by RT PCR: NEGATIVE

## 2022-01-17 LAB — LIPASE, BLOOD: Lipase: 32 U/L (ref 11–51)

## 2022-01-17 MED ORDER — SODIUM CHLORIDE 0.9 % IV BOLUS
500.0000 mL | Freq: Once | INTRAVENOUS | Status: AC
  Administered 2022-01-17: 500 mL via INTRAVENOUS

## 2022-01-17 MED ORDER — POTASSIUM CHLORIDE CRYS ER 20 MEQ PO TBCR: 20.0000 meq | EXTENDED_RELEASE_TABLET | Freq: Two times a day (BID) | ORAL | 0 refills | Status: AC

## 2022-01-17 MED ORDER — HYDROMORPHONE HCL 1 MG/ML IJ SOLN
0.5000 mg | Freq: Once | INTRAMUSCULAR | Status: AC
  Administered 2022-01-17: 0.5 mg via INTRAVENOUS
  Filled 2022-01-17: qty 1

## 2022-01-17 MED ORDER — SODIUM CHLORIDE 0.9 % IV SOLN: INTRAVENOUS | Status: DC

## 2022-01-17 MED ORDER — IOHEXOL 300 MG/ML  SOLN
100.0000 mL | Freq: Once | INTRAMUSCULAR | Status: AC | PRN
  Administered 2022-01-17: 100 mL via INTRAVENOUS

## 2022-01-17 MED ORDER — POTASSIUM CHLORIDE 10 MEQ/100ML IV SOLN
10.0000 meq | INTRAVENOUS | Status: AC
  Administered 2022-01-17 (×2): 10 meq via INTRAVENOUS
  Filled 2022-01-17 (×2): qty 100

## 2022-01-17 NOTE — ED Provider Notes (Addendum)
[facility] EMERGENCY DEPARTMENT Provider Note   CSN: [id number] Arrival date & time: 01/17/22  1126     History  Chief Complaint  Patient presents with   Abdominal Pain    Yolanda Keith is a 71 y.o. female.  Patient states that around midnight she started with left lower quadrant abdominal pain and kind of spitting up which almost seems like a cough type spitting up.  No blood in the vomit.  No blood in her bowel movements.  Reviewed patient's chart.  Patient had an admission January 30 into early February for toxic metabolic encephalopathy.  But the discharge summary seems to think everything was secondary to gastritis.  Patient denies any fevers.  Denies any diarrhea.  Past medical history significant for hypertension hyperlipidemia gastroesophageal reflux disease.  Patient smokes marijuana on a regular basis.  And smokes cigarettes every day.  In addition based on patient's recent hospitalization she was found to have cholelithiasis.  There was no evidence of acute cholecystitis at that time.  And they were not convinced that the gastritis was directly related.      Home Medications Prior to Admission medications   Medication Sig Start Date End Date Taking? Authorizing Provider  amLODipine (NORVASC) 10 MG tablet Take 10 mg by mouth in the morning.    [provider]  aspirin 81 MG chewable tablet Chew 81 mg by mouth in the morning.    [provider]  atorvastatin (LIPITOR) 20 MG tablet Take 20 mg by mouth every evening. 10/27/21   [provider]  busPIRone (BUSPAR) 10 MG tablet Take 20 mg by mouth 2 (two) times daily.    [provider]  desvenlafaxine (PRISTIQ) 50 MG 24 hr tablet Take 50 mg by mouth in the morning. 10/27/21   [provider]  docusate sodium (COLACE) 100 MG capsule Take 100 mg by mouth 2 (two) times daily.    [provider]  hydrochlorothiazide (HYDRODIURIL) 25 MG tablet Take 25 mg by mouth in the  morning.    [provider]  hydrOXYzine (ATARAX) 25 MG tablet Take 25 mg by mouth at bedtime.    [provider]  levETIRAcetam (KEPPRA) 500 MG tablet Take 500 mg by mouth 2 (two) times daily.    [provider]  melatonin 3 MG TABS tablet Take 6 mg by mouth at bedtime.    [provider]  metoprolol tartrate (LOPRESSOR) 25 MG tablet Take 25 mg by mouth 2 (two) times daily.    [provider]  omeprazole (PRILOSEC) 40 MG capsule Take 40 mg by mouth in the morning and at bedtime.    [provider]  potassium chloride (KLOR-CON) 20 MEQ packet Take 20 mEq by mouth 2 (two) times daily.    [provider]  pregabalin (LYRICA) 50 MG capsule Take 50 mg by mouth 3 (three) times daily.    [provider]  QUEtiapine (SEROQUEL) 100 MG tablet Take 100 mg by mouth at bedtime. 10/27/21   [provider]      Allergies    Penicillins, Latex, Sulfa antibiotics, and Zinc    Review of Systems   Review of Systems  Constitutional:  Negative for chills and fever.  HENT:  Negative for ear pain and sore throat.   Eyes:  Negative for pain and visual disturbance.  Respiratory:  Positive for cough. Negative for shortness of breath.   Cardiovascular:  Negative for chest pain and palpitations.  Gastrointestinal:  Positive for  abdominal pain, nausea and vomiting.  Genitourinary:  Negative for dysuria and hematuria.  Musculoskeletal:  Negative for arthralgias and back pain.  Skin:  Negative for color change and rash.  Neurological:  Negative for seizures and syncope.  All other systems reviewed and are negative.  Physical Exam Updated Vital Signs BP (!) 157/114   Pulse 100   Temp 98.1 F (36.7 C) (Oral)   Resp (!) 25   Ht 1.613 m (5' 3.5")   Wt 81.6 kg   SpO2 100%   BMI 31.39 kg/m  Physical Exam Vitals and nursing note reviewed.  Constitutional:      General: She is not in acute distress.    Appearance: She is  well-developed. She is not ill-appearing.  HENT:     Head: Normocephalic and atraumatic.  Eyes:     Conjunctiva/sclera: Conjunctivae normal.  Cardiovascular:     Rate and Rhythm: Normal rate and regular rhythm.     Heart sounds: No murmur heard. Pulmonary:     Effort: Pulmonary effort is normal. No respiratory distress.     Breath sounds: Normal breath sounds.  Abdominal:     General: There is no distension.     Palpations: Abdomen is soft.     Tenderness: There is abdominal tenderness. There is no guarding.     Comments: Tenderness to palpation left lower quadrant of the abdomen.  No guarding.  Musculoskeletal:        General: No swelling.     Cervical back: Neck supple.  Skin:    General: Skin is warm and dry.     Capillary Refill: Capillary refill takes less than 2 seconds.  Neurological:     General: No focal deficit present.     Mental Status: She is alert and oriented to person, place, and time.     Cranial Nerves: No cranial nerve deficit.     Sensory: No sensory deficit.     Motor: No weakness.     Coordination: Coordination normal.  Psychiatric:        Mood and Affect: Mood normal.    ED Results / Procedures / Treatments   Labs (all labs ordered are listed, but only abnormal results are displayed) Labs Reviewed  COMPREHENSIVE METABOLIC PANEL - Abnormal; Notable for the following components:      Result Value   Potassium 2.8 (*)    Glucose, Bld 133 (*)    Total Protein 8.2 (*)    All other components within normal limits  RESP PANEL BY RT-PCR (FLU A&B, COVID) ARPGX2  LIPASE, BLOOD  CBC WITH DIFFERENTIAL/PLATELET    EKG EKG Interpretation  Date/Time:  Tuesday January 17 2022 12:17:27 EDT Ventricular Rate:  95 PR Interval:  189 QRS Duration: 89 QT Interval:  376 QTC Calculation: 473 R Axis:   33 Text Interpretation: Sinus rhythm Low voltage, precordial leads Borderline T abnormalities, anterior leads No significant change since last tracing Confirmed by  Vanetta Mulders [id number]) on 01/17/2022 12:26:36 PM  Radiology No results found.  Procedures Procedures    Medications Ordered in ED Medications  0.9 %  sodium chloride infusion (has no administration in time range)  potassium chloride 10 mEq in 100 mL IVPB (has no administration in time range)  sodium chloride 0.9 % bolus 500 mL (500 mLs Intravenous New Bag/Given 01/17/22 1239)    ED Course/ Medical Decision Making/ A&P  Medical Decision Making Amount and/or Complexity of Data Reviewed Labs: ordered. Radiology: ordered.  Risk Prescription drug management.  CRITICAL CARE Performed by: Vanetta Mulders Total critical care time: 35 minutes Critical care time was exclusive of separately billable procedures and treating other patients. Critical care was necessary to treat or prevent imminent or life-threatening deterioration. Critical care was time spent personally by me on the following activities: development of treatment plan with patient and/or surrogate as well as nursing, discussions with consultants, evaluation of patient's response to treatment, examination of patient, obtaining history from patient or surrogate, ordering and performing treatments and interventions, ordering and review of laboratory studies, ordering and review of radiographic studies, pulse oximetry and re-evaluation of patient's condition.   Patient here with a complaint of left lower quadrant abdominal pain.  Not clear whether she is really vomiting or spitting things up from the lungs.  Or coughing them.  Complete metabolic panel significant for potassium of 2.8.  Otherwise liver function test are normal sodium and potassium are normal.  Lipase is normal no leukocytosis hemoglobin is normal.  Since patient having some of the difficulty with either spitting or coughing we will go ahead and get the potassium IV 10 mEq x 2.  While waiting for the CT scan results.  Labs otherwise are very  reassuring.  Chest x-ray still pending.  CT scan of the abdomen without any acute findings.  Does shows evidence of cholelithiasis.  Labs not concerning for acute cholecystitis.  Also no tenderness in that right upper quadrant area.  No acute findings to explain the left lower quadrant pain.  Once patient's potassium is then patient can be discharged home with some oral potassium and close follow-up with primary care provider.  Patient can be discharged when the second 10 mEq of potassium is completed.    Final Clinical Impression(s) / ED Diagnoses Final diagnoses:  Left lower quadrant abdominal pain  Hypokalemia    Rx / DC Orders ED Discharge Orders     None         Vanetta Mulders, MD 01/17/22 1250    Vanetta Mulders, MD 01/17/22 1250    Vanetta Mulders, MD 01/17/22 1426    Vanetta Mulders, MD 01/17/22 1515

## 2022-01-17 NOTE — ED Notes (Signed)
Pt coughing forceful, bring up large amount of clear mucous ?

## 2022-01-17 NOTE — ED Triage Notes (Signed)
Pt arrives pov, to triage in wheelchair with c/o abdominal pain, with n/v since early am today. Pt also reports chills. Last smoked marijuana last night ?

## 2022-01-17 NOTE — Discharge Instructions (Addendum)
Continue take the potassium supplement as directed for the next 2 days.  Prescription sent to your pharmacy.  Make an appointment follow-up with your doctor to have your potassium rechecked.  CT scan of the abdomen had no acute findings.  Nothing to explain the left lower quadrant abdominal pain which is reassuring. ?

## 2022-07-21 IMAGING — CR DG HIP (WITH OR WITHOUT PELVIS) 2-3V*R*
3 series · 3 of 3 positions shown · non-contrast
Comparison: None.

CLINICAL DATA: pain

EXAM:
DG HIP (WITH OR WITHOUT PELVIS) 2-3V RIGHT

[t pelvis a.p.]
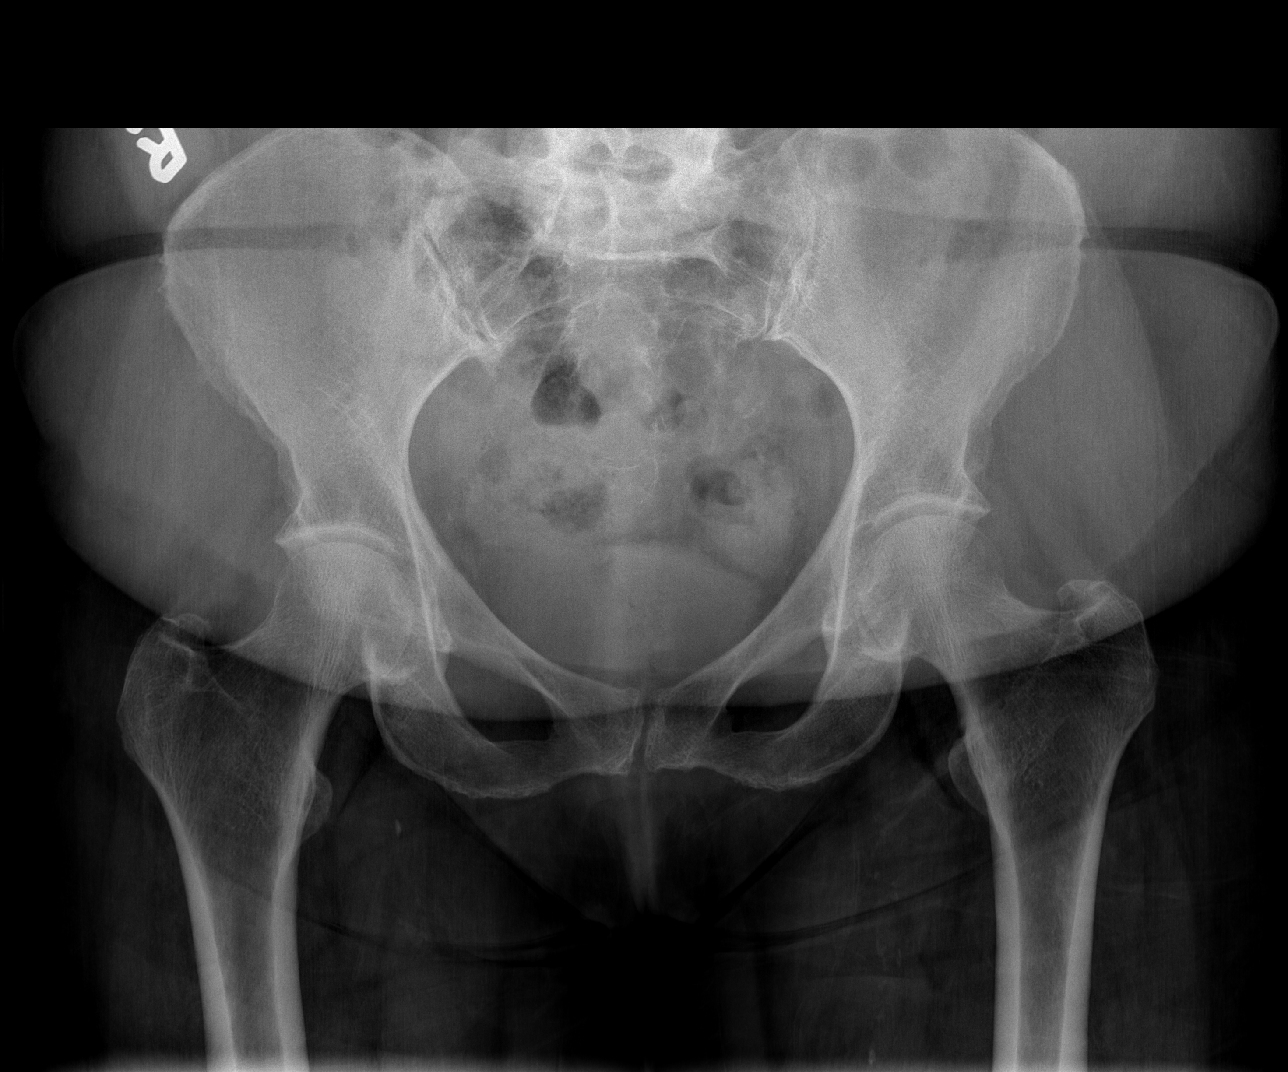

[t hip ap right]
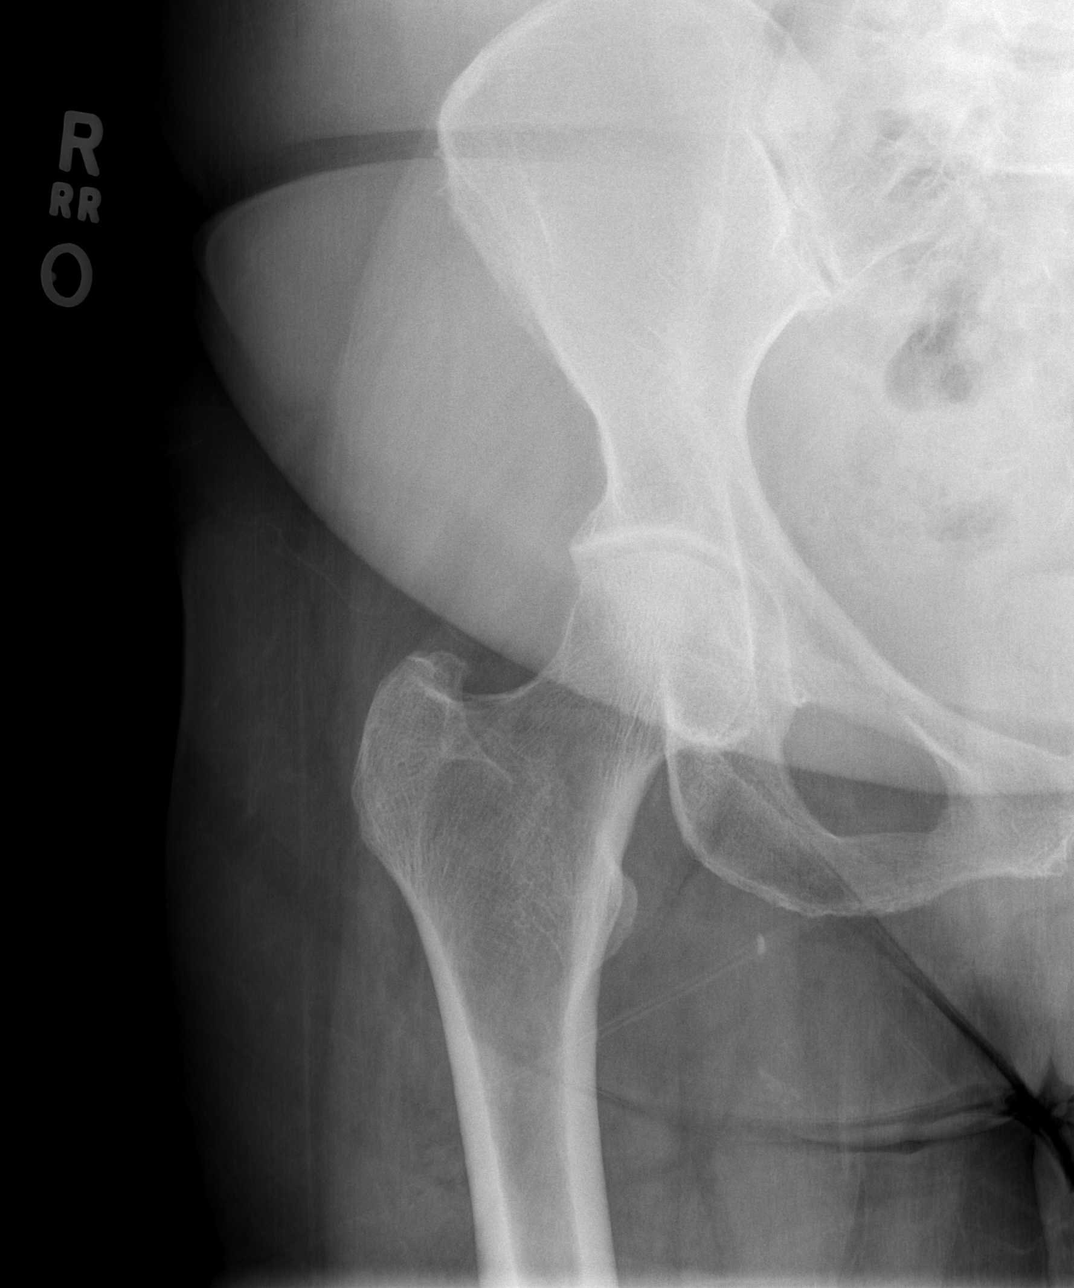

[t hip frog leg right]
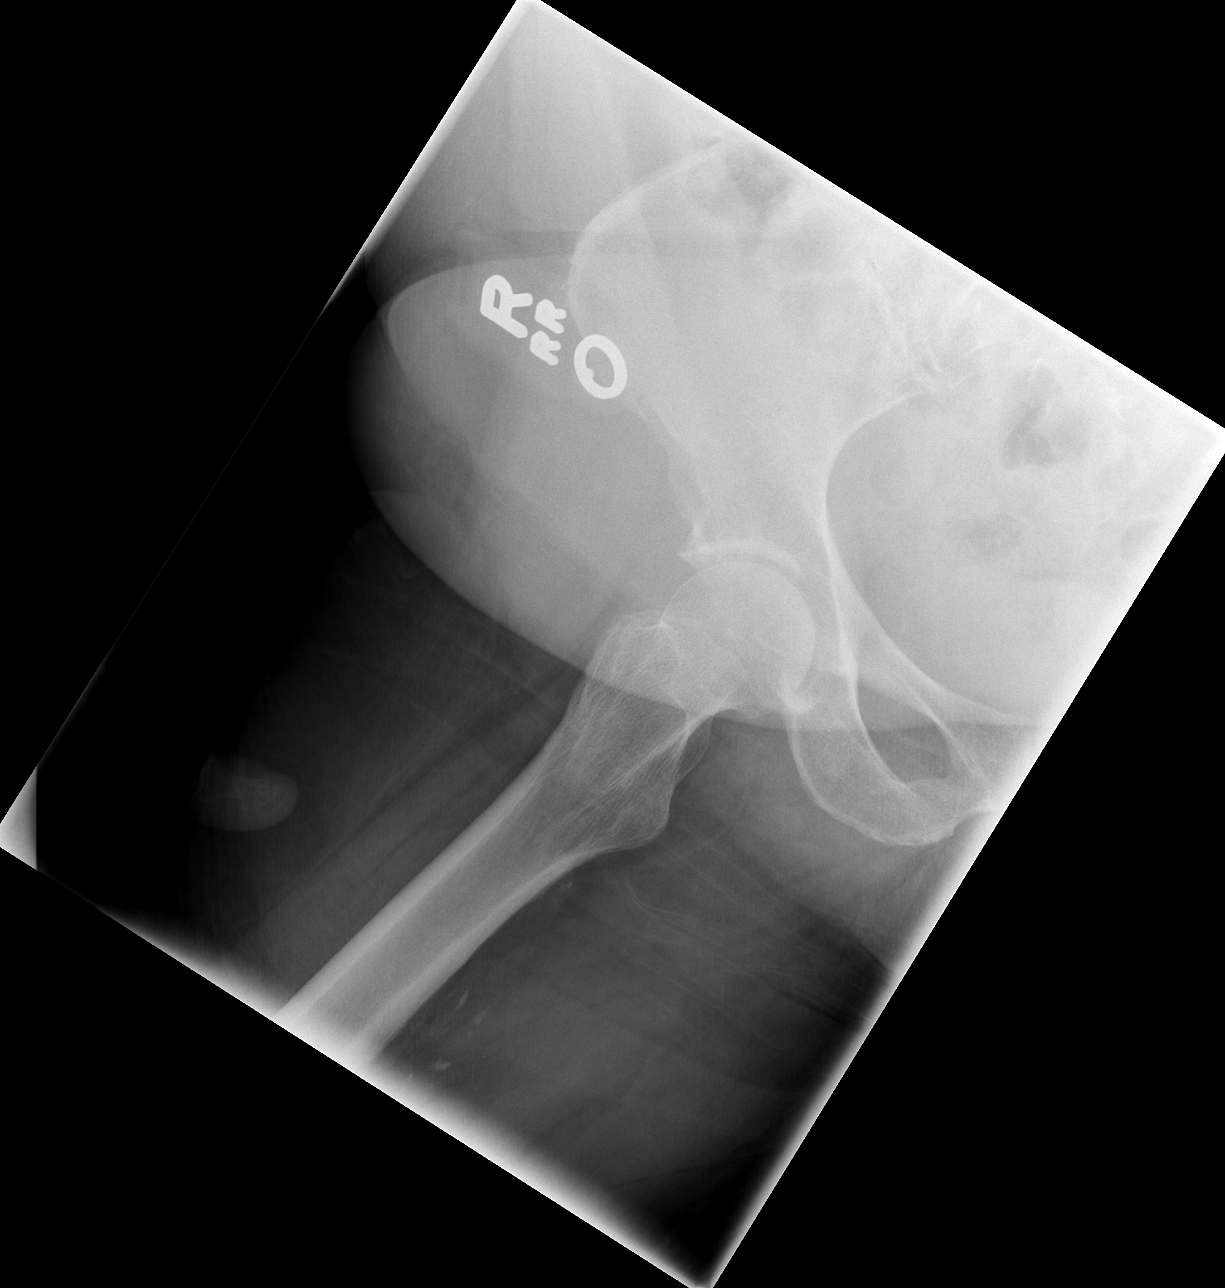

[3 of 3 positions shown; findings below may reference images not displayed]

FINDINGS: No evidence of acute fracture or joint malalignment. Mild bilateral
hip degenerative change. Partially imaged lower lumbar degenerative
change. Mild bilateral sacroiliac and pubic symphysis degenerative
change. Atherosclerotic vascular calcifications. Possible chain
sutures projecting along the midline anatomic pelvis.
IMPRESSION: 1. No evidence of acute fracture or traumatic malalignment
2. Mild bilateral hip degenerative change.

## 2023-03-17 IMAGING — DX DG CHEST 1V PORT
1 series · 1 of 1 positions shown · non-contrast
Comparison: 11/14/2021

CLINICAL DATA: Cough

EXAM:
PORTABLE CHEST 1 VIEW

[chest ap]
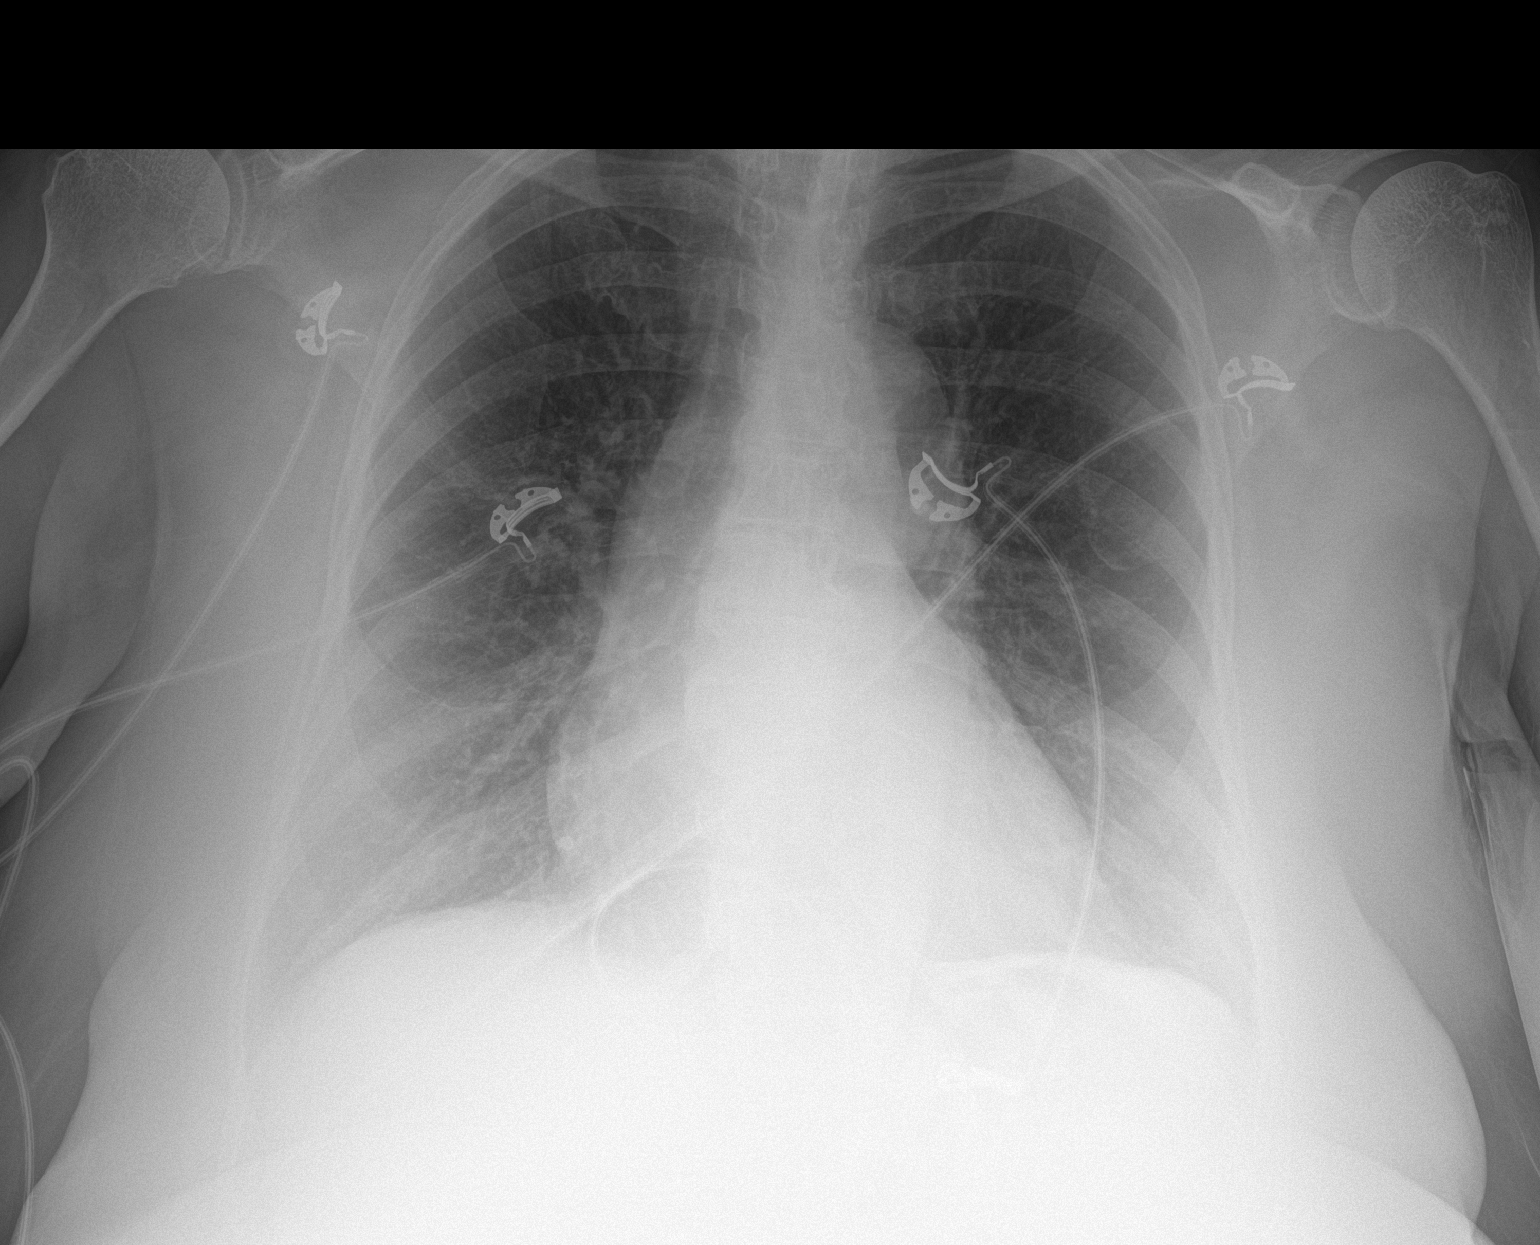

[1 of 1 positions shown; findings below may reference images not displayed]

FINDINGS: Transverse diameter of heart is in the upper limits of normal.
Thoracic aorta is tortuous. There are no signs of pulmonary edema or
focal pulmonary consolidation. There is no pleural effusion or
pneumothorax.
IMPRESSION: No active disease.

## 2023-10-02 ENCOUNTER — Emergency Department (HOSPITAL_BASED_OUTPATIENT_CLINIC_OR_DEPARTMENT_OTHER): Payer: 59

## 2023-10-02 ENCOUNTER — Emergency Department (HOSPITAL_BASED_OUTPATIENT_CLINIC_OR_DEPARTMENT_OTHER)
Admission: EM | Admit: 2023-10-02 | Discharge: 2023-10-02 | Disposition: A | Discharge status: Home / Self Care | Payer: 59 | Attending: Emergency Medicine | Admitting: Emergency Medicine

## 2023-10-02 ENCOUNTER — Encounter (HOSPITAL_BASED_OUTPATIENT_CLINIC_OR_DEPARTMENT_OTHER): Payer: Self-pay | Admitting: Urology

## 2023-10-02 DIAGNOSIS — Z7982 Long term (current) use of aspirin: Secondary | ICD-10-CM | POA: Diagnosis not present

## 2023-10-02 DIAGNOSIS — K5900 Constipation, unspecified: Secondary | ICD-10-CM | POA: Diagnosis not present

## 2023-10-02 DIAGNOSIS — R1084 Generalized abdominal pain: Secondary | ICD-10-CM | POA: Diagnosis present

## 2023-10-02 DIAGNOSIS — R112 Nausea with vomiting, unspecified: Secondary | ICD-10-CM | POA: Diagnosis not present

## 2023-10-02 DIAGNOSIS — Z91040 Latex allergy status: Secondary | ICD-10-CM | POA: Insufficient documentation

## 2023-10-02 LAB — COMPREHENSIVE METABOLIC PANEL
ALT: 25 U/L (ref 0–44)
AST: 32 U/L (ref 15–41)
Albumin: 4.9 g/dL (ref 3.5–5.0)
Alkaline Phosphatase: 89 U/L (ref 38–126)
Anion gap: 11 (ref 5–15)
BUN: 7 mg/dL — ABNORMAL LOW (ref 8–23)
CO2: 25 mmol/L (ref 22–32)
Calcium: 10.4 mg/dL — ABNORMAL HIGH (ref 8.9–10.3)
Chloride: 100 mmol/L (ref 98–111)
Creatinine, Ser: 0.82 mg/dL (ref 0.44–1.00)
GFR, Estimated: >60 mL/min (ref >60)
Glucose, Bld: 117 mg/dL — ABNORMAL HIGH (ref 70–99)
Potassium: 3.2 mmol/L — ABNORMAL LOW (ref 3.5–5.1)
Sodium: 136 mmol/L (ref 135–145)
Total Bilirubin: 0.7 mg/dL (ref <1.2)
Total Protein: 7.9 g/dL (ref 6.5–8.1)

## 2023-10-02 LAB — URINALYSIS, ROUTINE W REFLEX MICROSCOPIC
Bilirubin Urine: NEGATIVE
Glucose, UA: NEGATIVE mg/dL
Hgb urine dipstick: NEGATIVE
Ketones, ur: NEGATIVE mg/dL
Leukocytes,Ua: NEGATIVE
Nitrite: NEGATIVE
Protein, ur: NEGATIVE mg/dL
Specific Gravity, Urine: 1.01 (ref 1.005–1.030)
pH: 6.5 (ref 5.0–8.0)

## 2023-10-02 LAB — CBC
HCT: 43.4 % (ref 36.0–46.0)
Hemoglobin: 14.2 g/dL (ref 12.0–15.0)
MCH: 27.2 pg (ref 26.0–34.0)
MCHC: 32.7 g/dL (ref 30.0–36.0)
MCV: 83.1 fL (ref 80.0–100.0)
Platelets: 286 10*3/uL (ref 150–400)
RBC: 5.22 MIL/uL — ABNORMAL HIGH (ref 3.87–5.11)
RDW: 14 % (ref 11.5–15.5)
WBC: 7.4 10*3/uL (ref 4.0–10.5)
nRBC: 0 % (ref 0.0–0.2)

## 2023-10-02 LAB — LIPASE, BLOOD: Lipase: 31 U/L (ref 11–51)

## 2023-10-02 MED ORDER — IOHEXOL 300 MG/ML  SOLN
100.0000 mL | Freq: Once | INTRAMUSCULAR | Status: AC | PRN
  Administered 2023-10-02: 100 mL via INTRAVENOUS

## 2023-10-02 MED ORDER — ALUM & MAG HYDROXIDE-SIMETH 200-200-20 MG/5ML PO SUSP
30.0000 mL | Freq: Once | ORAL | Status: AC
  Administered 2023-10-02: 30 mL via ORAL
  Filled 2023-10-02: qty 30

## 2023-10-02 MED ORDER — LIDOCAINE VISCOUS HCL 2 % MT SOLN
15.0000 mL | Freq: Once | OROMUCOSAL | Status: AC
  Administered 2023-10-02: 15 mL via ORAL
  Filled 2023-10-02: qty 15

## 2023-10-02 MED ORDER — SUCRALFATE 1 G PO TABS
1.0000 g | ORAL_TABLET | Freq: Once | ORAL | Status: AC
  Administered 2023-10-02: 1 g via ORAL
  Filled 2023-10-02: qty 1

## 2023-10-02 MED ORDER — SUCRALFATE 1 G PO TABS: 1.0000 g | ORAL_TABLET | Freq: Three times a day (TID) | ORAL | 0 refills | Status: AC

## 2023-10-02 MED ORDER — POTASSIUM CHLORIDE CRYS ER 20 MEQ PO TBCR
40.0000 meq | EXTENDED_RELEASE_TABLET | Freq: Once | ORAL | Status: AC
  Administered 2023-10-02: 40 meq via ORAL
  Filled 2023-10-02: qty 2

## 2023-10-02 NOTE — ED Triage Notes (Signed)
Pt states her stomach is on fire [place] started 2 weeks ago  Also reports ha and N/V  Denies Diarrhea  NO fever    Recent ER visit to [facility] and given antibiotic

## 2023-10-02 NOTE — ED Notes (Addendum)
Fall risk armband Fall risk socks Fall risk sign on door

## 2023-10-02 NOTE — ED Provider Triage Note (Signed)
Emergency Medicine Provider Triage Evaluation Note  Yolanda Keith , a 72 y.o. female  was evaluated in triage.  Pt complains of diffuse burning abdominal pain is been present for months but worsening over the past 2 weeks.  She also reports some nausea and vomiting.  She describes her abdominal pain is diffuse and burning in sensation.  She reports its like heartburn but in her abdomen.  She denies any diarrhea or constipation.  Denies any black or bloody stools.  Denies any fever or any urinary symptoms.  She was recently seen at [facility] and given antibiotic but reports no improvement of her pain or symptoms.  Review of Systems  Positive:  Negative:   Physical Exam  BP (!) 135/117 (BP Location: Right Arm)   Pulse 84   Temp 98.2 F (36.8 C)   Resp 18   Ht 5\' 4"  (1.626 m)   Wt 81.6 kg   SpO2 99%   BMI 30.88 kg/m  Gen:   Awake, no distress   Resp:  Normal effort  MSK:   Moves extremities without difficulty  Other:  Diffuse abdominal tenderness palpation.  Soft.  No guarding or rebound.  Medical Decision Making  Medically screening exam initiated at 2:20 PM.  Appropriate orders placed.  Tanara Penafiel was informed that the remainder of the evaluation will be completed by another provider, this initial triage assessment does not replace that evaluation, and the importance of remaining in the ED until their evaluation is complete.  CT and labs ordered.    Achille Rich, PA-C 10/02/23 1423

## 2023-10-02 NOTE — ED Notes (Signed)
Pt. Reports she has had a burning feeling in her stomach and the feeling is not going away with the meds she received 2 weeks ago.  Pt. Yolanda Keith it feels like her stomach is on fire.

## 2023-10-02 NOTE — ED Provider Notes (Signed)
[place] [facility] AT [facility] Provider Note   CSN: [id number] Arrival date & time: 10/02/23  1349     History  Chief Complaint  Patient presents with   Abdominal Pain    Yolanda Keith is a 72 y.o. female.  The history is provided by the patient and medical records. No language interpreter was used.  Abdominal Pain Pain location:  Generalized Pain quality: burning and cramping   Pain radiates to:  Does not radiate Pain severity:  Moderate Onset quality:  Gradual Duration:  2 weeks Timing:  Intermittent Progression:  Waxing and waning Chronicity:  New Context: not trauma   Relieved by:  Nothing Worsened by:  Eating Ineffective treatments:  None tried Associated symptoms: constipation, nausea and vomiting   Associated symptoms: no chest pain, no chills, no cough, no diarrhea, no dysuria, no fatigue, no fever, no shortness of breath, no vaginal bleeding and no vaginal discharge        Home Medications Prior to Admission medications   Medication Sig Start Date End Date Taking? Authorizing Provider  amLODipine (NORVASC) 10 MG tablet Take 10 mg by mouth in the morning.    [provider]  aspirin 81 MG chewable tablet Chew 81 mg by mouth in the morning.    [provider]  atorvastatin (LIPITOR) 20 MG tablet Take 20 mg by mouth every evening. 10/27/21   [provider]  busPIRone (BUSPAR) 10 MG tablet Take 20 mg by mouth 2 (two) times daily.    [provider]  desvenlafaxine (PRISTIQ) 50 MG 24 hr tablet Take 50 mg by mouth in the morning. 10/27/21   [provider]  docusate sodium (COLACE) 100 MG capsule Take 100 mg by mouth 2 (two) times daily.    [provider]  hydrochlorothiazide (HYDRODIURIL) 25 MG tablet Take 25 mg by mouth in the morning.    [provider]  hydrOXYzine (ATARAX) 25 MG tablet Take 25 mg by mouth at bedtime.    [provider]  levETIRAcetam (KEPPRA)  500 MG tablet Take 500 mg by mouth 2 (two) times daily.    [provider]  melatonin 3 MG TABS tablet Take 6 mg by mouth at bedtime.    [provider]  metoprolol tartrate (LOPRESSOR) 25 MG tablet Take 25 mg by mouth 2 (two) times daily.    [provider]  omeprazole (PRILOSEC) 40 MG capsule Take 40 mg by mouth in the morning and at bedtime.    [provider]  potassium chloride (KLOR-CON) 20 MEQ packet Take 20 mEq by mouth 2 (two) times daily.    [provider]  potassium chloride SA (KLOR-CON M) 20 MEQ tablet Take 1 tablet (20 mEq total) by mouth 2 (two) times daily. 01/17/22   Vanetta Mulders, MD  pregabalin (LYRICA) 50 MG capsule Take 50 mg by mouth 3 (three) times daily.    [provider]  QUEtiapine (SEROQUEL) 100 MG tablet Take 100 mg by mouth at bedtime. 10/27/21   [provider]      Allergies    Penicillins, Latex, Sulfa antibiotics, and Zinc    Review of Systems   Review of Systems  Constitutional:  Negative for chills, fatigue and fever.  HENT:  Negative for congestion.   Respiratory:  Negative for cough, chest tightness and shortness of breath.   Cardiovascular:  Negative for chest pain.  Gastrointestinal:  Positive for abdominal pain, constipation, nausea and vomiting. Negative for diarrhea.  Genitourinary:  Negative for dysuria, flank pain, vaginal bleeding and vaginal discharge.  Musculoskeletal:  Negative for back pain and neck pain.  Skin:  Negative for rash and wound.  Neurological:  Negative for headaches.  Psychiatric/Behavioral:  Negative for agitation and confusion.   All other systems reviewed and are negative.   Physical Exam Updated Vital Signs BP (!) 135/117 (BP Location: Right Arm)   Pulse 84   Temp 98.2 F (36.8 C)   Resp 18   Ht 5\' 4"  (1.626 m)   Wt 81.6 kg   SpO2 99%   BMI 30.88 kg/m  Physical Exam Vitals and nursing note reviewed.  Constitutional:      General: She is not in  acute distress.    Appearance: She is well-developed. She is not ill-appearing, toxic-appearing or diaphoretic.  HENT:     Head: Normocephalic and atraumatic.     Nose: No congestion or rhinorrhea.     Mouth/Throat:     Mouth: Mucous membranes are moist.     Pharynx: No oropharyngeal exudate or posterior oropharyngeal erythema.  Eyes:     Extraocular Movements: Extraocular movements intact.     Conjunctiva/sclera: Conjunctivae normal.     Pupils: Pupils are equal, round, and reactive to light.  Cardiovascular:     Rate and Rhythm: Normal rate and regular rhythm.     Pulses: Normal pulses.     Heart sounds: No murmur heard. Pulmonary:     Effort: Pulmonary effort is normal. No respiratory distress.     Breath sounds: Normal breath sounds. No wheezing, rhonchi or rales.  Chest:     Chest wall: No tenderness.  Abdominal:     General: Abdomen is flat. Bowel sounds are normal. There is no distension.     Palpations: Abdomen is soft.     Tenderness: There is no abdominal tenderness. There is no right CVA tenderness, left CVA tenderness, guarding or rebound.  Musculoskeletal:        General: No swelling or tenderness.     Cervical back: Neck supple.  Skin:    General: Skin is warm and dry.     Capillary Refill: Capillary refill takes less than 2 seconds.     Findings: No erythema or rash.  Neurological:     General: No focal deficit present.     Mental Status: She is alert.  Psychiatric:        Mood and Affect: Mood normal.     ED Results / Procedures / Treatments   Labs (all labs ordered are listed, but only abnormal results are displayed) Labs Reviewed  COMPREHENSIVE METABOLIC PANEL - Abnormal; Notable for the following components:      Result Value   Potassium 3.2 (*)    Glucose, Bld 117 (*)    BUN 7 (*)    Calcium 10.4 (*)    All other components within normal limits  CBC - Abnormal; Notable for the following components:   RBC 5.22 (*)    All other components within  normal limits  LIPASE, BLOOD  URINALYSIS, ROUTINE W REFLEX MICROSCOPIC    EKG None  Radiology CT ABDOMEN PELVIS W CONTRAST Result Date: 10/02/2023 CLINICAL DATA:  Abdominal pain. EXAM: CT ABDOMEN AND PELVIS WITH CONTRAST TECHNIQUE: Multidetector CT imaging of the abdomen and pelvis was performed using the standard protocol following bolus administration of intravenous contrast. RADIATION DOSE REDUCTION: This exam was performed according to the departmental dose-optimization program which includes automated exposure control, adjustment of the mA  and/or kV according to patient size and/or use of iterative reconstruction technique. CONTRAST:  OMNIPAQUE IOHEXOL 300 MG/ML  SOLN COMPARISON:  CT abdomen pelvis dated 09/26/2023. FINDINGS: Lower chest: Minimal bibasilar dependent atelectasis. The visualized lung bases are otherwise clear. No intra-abdominal free air or free fluid. Hepatobiliary: Apparent mild fatty liver. No biliary dilatation. The gallbladder is unremarkable. Multiple small gallstones. No pericholecystic fluid or evidence of acute cholecystitis by CT. Pancreas: Unremarkable. No pancreatic ductal dilatation or surrounding inflammatory changes. Spleen: Normal in size without focal abnormality. Adrenals/Urinary Tract: The adrenal glands are unremarkable. Small left renal upper pole cyst. There is no hydronephrosis on either side. There is symmetric enhancement and excretion of contrast by both kidneys. The visualized ureters and urinary bladder appear unremarkable. Stomach/Bowel: There is sigmoid diverticulosis without active inflammatory changes. There is a small hiatal hernia. There is no bowel obstruction or active inflammation. Appendectomy. Vascular/Lymphatic: Mild aortoiliac atherosclerotic disease. The IVC is unremarkable. No portal venous gas. There is no adenopathy. Reproductive: Hysterectomy.  No adnexal masses. Other: Midline vertical anterior abdominal wall incisional scar.  Musculoskeletal: Degenerative changes of the spine. No acute osseous pathology. IMPRESSION: 1. No acute intra-abdominal or pelvic pathology. 2. Sigmoid diverticulosis. No bowel obstruction. 3. Cholelithiasis. 4.  Aortic Atherosclerosis (ICD10-I70.0). Electronically Signed   By: Elgie Collard M.D.   On: 10/02/2023 16:52    Procedures Procedures    Medications Ordered in ED Medications  iohexol (OMNIPAQUE) 300 MG/ML solution 100 mL (100 mLs Intravenous Contrast Given 10/02/23 1503)  sucralfate (CARAFATE) tablet 1 g (1 g Oral Given 10/02/23 1920)  alum & mag hydroxide-simeth (MAALOX/MYLANTA) 200-200-20 MG/5ML suspension 30 mL (30 mLs Oral Given 10/02/23 1924)    And  lidocaine (XYLOCAINE) 2 % viscous mouth solution 15 mL (15 mLs Oral Given 10/02/23 1924)  potassium chloride SA (KLOR-CON M) CR tablet 40 mEq (40 mEq Oral Given 10/02/23 1922)    ED Course/ Medical Decision Making/ A&P                                 Medical Decision Making Amount and/or Complexity of Data Reviewed Labs: ordered.  Risk OTC drugs. Prescription drug management.     Shya Younge is a 72 y.o. female with a past medical history significant for hypertension, hyperlipidemia, GERD, previous appendectomy and recent visit for abdominal pain to different emergency department who presents with nausea, vomiting, and abdominal discomfort.  According to patient, the last 2 weeks she has had burning especially after eating in her abdomen.  She reports nausea and vomiting.  She denies constipation or diarrhea and reports small bowel movements.  She reports no dysuria or urinary symptoms.  Denies any trauma.  Reports she went to the emergency department at another facility about a week ago and had a CT scan that was reassuring and was started on Flagyl.  She also was on Prilosec already.  She has not seen a GI doctor however and has never had endoscopy she reports.  She reports that the burning pain is moderate to severe  at times and due to the persistence she wanted to make sure there is not something else going on.  Otherwise she denies fevers, chills, congestion, cough, or other complaints.  On my exam, lungs are clear.  Chest nontender.  Abdomen not focally tender on my exam.  Bowel sounds were appreciated.  Flanks and back nontender.  Patient has moist mucous membranes.  Patient  moving all extremities.  Patient had workup starting in triage including labs and imaging given her severe symptoms.                  Her lipase was normal, doubt pancreatitis.  CBC showed no leukocytosis and no anemia.  Urinalysis showed no infection.  Metabolic panel showed low potassium and elevated calcium but otherwise showed normal liver function and kidney function.  CT scan showed diverticulosis but no diverticulitis and showed some gallstones.  No acute cholecystitis seen.  No obstruction.  No evidence of significant pathology in her abdomen and pelvis.  We went through all the findings together and I clinically suspect the patient has some reflux versus ulceration versus gastritis given the burning pain especially after eating.  We discussed that as she is already on Prilosec she is to stay on that but we will add Carafate.   After GI cocktail and Carafate patient felt much better and was able to get food down.  We feel she is safe for discharge home and will follow-up with outpatient GI.  Will give her prescription for Carafate to add to her Prilosec at home.  She agrees with plan of care and had no other questions or concerns.  Patient discharged in good condition with improved symptoms and reassuring vital signs on reassessment.        Final Clinical Impression(s) / ED Diagnoses Final diagnoses:  Generalized abdominal pain  Nausea and vomiting, unspecified vomiting type    Rx / DC Orders ED Discharge Orders          Ordered    sucralfate (CARAFATE) 1 g tablet  3 times daily with meals & bedtime         10/02/23 2131            Clinical Impression: 1. Generalized abdominal pain   2. Nausea and vomiting, unspecified vomiting type     Disposition: Discharge  Condition: Good  I have discussed the results, Dx and Tx plan with the pt(& family if present). He/she/they expressed understanding and agree(s) with the plan. Discharge instructions discussed at great length. Strict return precautions discussed and pt &/or family have verbalized understanding of the instructions. No further questions at time of discharge.    New Prescriptions   SUCRALFATE (CARAFATE) 1 G TABLET    Take 1 tablet (1 g total) by mouth 4 (four) times daily -  with meals and at bedtime.    Follow Up: Caffie Damme, MD [address] [place] [address] [phone]     Gastroenterology, Deboraha Sprang [address] [place] [address] [place] [address] [phone]     [facility] [address] [place] [address] [phone]       Tacie Mccuistion, Canary Brim, MD 10/02/23 2133

## 2023-10-02 NOTE — Discharge Instructions (Addendum)
Your history, exam, and evaluation today seem consistent with gastritis or irritation in your GI tract causing the burning and abdominal discomfort that you have had with nausea and vomiting.  The CT scan recently was reassuring and it was also reassuring today without evidence of surgical problem.  Your labs were overall reassuring aside from mildly decreased potassium which we gave oral repletion for today.  As your symptoms improve with medications, feel you are safer discharge home to add Carafate to your list of medications you take but we do want you to call to see a gastroenterologist soon as possible.  Please rest and stay hydrated and follow-up with your primary doctor.  If any symptoms change or worsen acutely, return to the nearest emergency department.

## 2023-10-05 ENCOUNTER — Encounter: Payer: Self-pay | Admitting: Pediatrics

## 2023-12-10 NOTE — Progress Notes (Deleted)
 [facility] Initial Consultation   Referring Provider Caffie Damme, MD [address] [place],  [place] [address]  Primary Care Provider Caffie Damme, MD  Patient Profile: Abby Tucholski is a 73 y.o. female who is seen in consultation in the [facility] at the request of Dr. Katrinka Blazing for evaluation and management of the problem(s) noted below.  Problem List: Abdominal pain Nausea and vomiting History of cholelithiasis without cholecystitis Hepatic steatosis   History of Present Illness   Ms. Nesheim is a 73 y.o. female with a history of HTN who is referred to the gastroenterology office for evaluation of abdominal pain, nausea and vomiting     Last colonoscopy: *** Last endoscopy: ***  Last Abd CT/CTE/MRE: CTAP 09/2023 -no acute intra-abdominal abnormality, sigmoid diverticulosis, cholelithiasis   GI Review of Symptoms Significant for {[id number]}. Otherwise negative.  General Review of Systems  Review of systems is significant for the pertinent positives and negatives as listed per the HPI.  Full ROS is otherwise negative.  Past Medical History   Past Medical History:  Diagnosis Date   Arthritis    GERD (gastroesophageal reflux disease)    Hyperlipidemia    Hypertension      Past Surgical History   Past Surgical History:  Procedure Laterality Date   APPENDECTOMY     COLON SURGERY     JOINT REPLACEMENT       Allergies and Medications   Allergies  Allergen Reactions   Penicillins Hives   Latex Rash   Sulfa Antibiotics Itching        Zinc     Shakes, restlessness    @MEDSTODAY @  Family History  No family history on file.   Social History   Social History   Tobacco Use   Smoking status: Every Day    Types: Cigarettes   Smokeless tobacco: Never  Vaping Use   Vaping status: Never Used  Substance Use Topics   Alcohol use: Yes    Comment: social   Drug use: Yes    Types: Marijuana   Kaneisha reports that she  has been smoking cigarettes. She has never used smokeless tobacco. She reports current alcohol use. She reports current drug use. Drug: Marijuana.  Vital Signs and Physical Examination  There were no vitals filed for this visit. There is no height or weight on file to calculate BMI.    General: Well developed, well nourished, no acute distress Head: Normocephalic and atraumatic Eyes: Sclerae anicteric, EOMI Ears: Normal auditory acuity Mouth: No deformities or lesions noted Lungs: Clear throughout to auscultation Heart: Regular rate and rhythm; No murmurs, rubs or bruits Abdomen: Soft, non tender and non distended. No masses, hepatosplenomegaly or hernias noted. Normal Bowel sounds Rectal: Musculoskeletal: Symmetrical with no gross deformities  Pulses:  Normal pulses noted Extremities: No edema or deformities noted Neurological: Alert oriented x 4, grossly nonfocal Psychological:  Alert and cooperative. Normal mood and affect  Review of Data  The following data was reviewed at the time of this encounter:  Laboratory Studies      Latest Ref Rng & Units 10/02/2023    1:59 PM 01/17/2022   12:07 PM 11/16/2021    1:47 AM  CBC  WBC 4.0 - 10.5 K/uL 7.4  9.2  8.1   Hemoglobin 12.0 - 15.0 g/dL 60.4  54.0  98.1   Hematocrit 36.0 - 46.0 % 43.4  40.3  40.2   Platelets 150 - 400 K/uL 286  278  204     Lab  Results  Component Value Date   LIPASE 31 10/02/2023      Latest Ref Rng & Units 10/02/2023    1:59 PM 01/17/2022   12:07 PM 11/18/2021    1:10 AM  CMP  Glucose 70 - 99 mg/dL 409  811  914   BUN 8 - 23 mg/dL 7  9  9    Creatinine 0.44 - 1.00 mg/dL 7.82  9.56  2.13   Sodium 135 - 145 mmol/L 136  141  139   Potassium 3.5 - 5.1 mmol/L 3.2  2.8  3.6   Chloride 98 - 111 mmol/L 100  104  105   CO2 22 - 32 mmol/L 25  22  24    Calcium 8.9 - 10.3 mg/dL 08.6  57.8  46.9   Total Protein 6.5 - 8.1 g/dL 7.9  8.2    Total Bilirubin <1.2 mg/dL 0.7  0.4    Alkaline Phos 38 - 126 U/L 89  91     AST 15 - 41 U/L 32  29    ALT 0 - 44 U/L 25  18       Imaging Studies  CTAP 09/2023 1. No acute intra-abdominal or pelvic pathology. 2. Sigmoid diverticulosis. No bowel obstruction. 3. Cholelithiasis. 4.  Aortic Atherosclerosis (ICD10-I70.0).  CTAP 11/2022 1. No acute findings.  2. Hepatic steatosis.  3. Cholelithiasis.  4. Unchanged small hiatal hernia.   CTAP 01/2022 1. No acute intra-abdominal process. 2. Unchanged cholelithiasis. 3. Aortic Atherosclerosis (ICD10-I70.0).  RUQ [place] 10/2021 1. Cholelithiasis without evidence of cholecystitis. 2. Hepatic steatosis.  CTAP 10/2021 1. A cause for the patient's right lower quadrant abdominal pain is not identified. There is no dilated bowel. The appendix is surgically absent. 2. 1.8 gallstone in the gallbladder noted but without pericholecystic fluid or gallbladder wall thickening. 3. There are findings suspicious for sacral fracture on the recent conventional radiographs, although the appearance is less suspicious on today's CT. 4. Atherosclerosis is present, including aortoiliac atherosclerotic disease. 5. Substantial foraminal impingement at L3-4, L4-5, and L5-S1. 6. Small type 1 hiatal hernia.    GI Procedures and Studies      Clinical Impression  It is my clinical impression that Ms. Meiners is a 73 y.o. female with;  ***  Plan  *** *** *** *** ***  Planned Follow Up No follow-ups on file.  The patient or caregiver verbalized understanding of the material covered, with no barriers to understanding. All questions were answered. Patient or caregiver is agreeable with the plan outlined above.    It was a pleasure to see Harriett Sine.  If you have any questions or concerns regarding this evaluation, do not hesitate to contact me.  Maren Beach, MD [facility]

## 2023-12-11 ENCOUNTER — Ambulatory Visit: Payer: 59 | Admitting: Pediatrics

## 2023-12-11 DIAGNOSIS — K760 Fatty (change of) liver, not elsewhere classified: Secondary | ICD-10-CM

## 2023-12-11 DIAGNOSIS — R1011 Right upper quadrant pain: Secondary | ICD-10-CM

## 2024-04-09 ENCOUNTER — Encounter: Payer: Self-pay | Admitting: Gastroenterology

## 2024-05-15 NOTE — Progress Notes (Signed)
 Ciel Chervenak [id number] 1951/03/06   Chief Complaint: Discuss endoscopy  Referring Provider: Claudene Round, MD Primary GI MD: Sampson  HPI: Pricilla Moehle is a 73 y.o. female with past medical history of arthritis, GERD, HLD, HTN, appendectomy, DVT, stroke, seizure disorder on Keppra  who presents today for ED follow-up.    09/26/2023 patient seen in the ED for complaint of 1 week of nausea and vomiting and associated LLQ abdominal pain.  Reported 1 week prior she had a viral illness which resolved but abdominal pain persisted along with nausea and vomiting.  Denied hematemesis.  Labs unremarkable.  Negative UA.  CT A/P showed diverticulosis with no evidence of acute diverticulitis.  She was treated with Toradol and Zofran  with improvement in symptoms.  10/02/2023 patient seen again in the ED with persistent nausea, vomiting, and abdominal discomfort.  Reported 2 weeks of burning in her abdomen especially after eating.  Denied constipation or diarrhea, reported small bowel movements.  She had been started on Flagyl  following last ED visit.  Was on Prilosec already but denied seeing a GI doctor and denied prior endoscopy. Lipase was normal, CBC showed no leukocytosis and no anemia.  No infection seen on urinalysis.  Metabolic panel showed low potassium and elevated calcium  but otherwise normal with normal liver and kidney function.  CT showed diverticulosis without diverticulitis, as well as gallstones with no acute cholecystitis seen.  No obstruction or evidence of significant pathology in the abdomen and pelvis. Clinical suspicion for reflux versus ulceration versus gastritis.  She was given a GI cocktail and started on Carafate  in addition to the Prilosec she took at home.  Advised to follow-up with outpatient GI.   Patient here today with her granddaughter.  States that since being seen in the ED she continues to have intermittent episodes of nausea and vomiting.  These episodes come and  go, can last between 2 and 4 days, then symptoms resolve and she does well for a while.  States this occurs about once a month.  Denies any clear inciting factors.  She has noticed that in the morning she has some throat congestion but will cough to clear her throat and bring up phlegm, and feels better after that.  She denies use of Prilosec or Carafate , but states she is taking Tums as needed.  She reports intermittent abdominal pain which is not constant but can be severe when it occurs.  Pain can be sharp or burning in nature and is generalized or can occur in her lower abdomen in the morning.  She typically has a bowel movement once a day after drinking coffee, but endorses occasional constipation.  Abdominal pain is sometimes, but not always, relieved by bowel movement.  She denies blood in her stool or melena.  She reports decreased appetite and food fear due to symptoms. She has been avoiding onions, spicy foods, and fried foods as she seems to have more problems with these.  She wears upper and lower dentures.  States that occasionally it can be difficult to swallow but if she follows food with liquids things go down okay.  Occasionally may have a sensation of something being in her throat.  Denies food getting stuck in her throat or chest.  She denies prior EGD or colonoscopy.  Denies other forms of colon cancer screening.  Denies family history of colon cancer or IBD.  She smokes 4 cigarettes a day and marijuana twice a day.  Has been smoking marijuana for 6 years.  Denies  heart or lung problems.  Reports history of stroke.  Per care everywhere, stroke occurred 12/2021.  She is not on blood thinners.  Taking aspirin  81 mg daily.  She denies any shortness of breath or chest pain.  History of seizures, on Keppra . Reports last seizure was 3 years ago. Sees [facility], last office visit 07/2023 and per note last seizure was a silent staring episode 10/2021. She has follow-up  scheduled for 07/18/2024.  Previous GI Procedures/Imaging   CT A/P 10/02/2023 1. No acute intra-abdominal or pelvic pathology. 2. Sigmoid diverticulosis. No bowel obstruction. 3. Cholelithiasis. 4. Aortic Atherosclerosis (ICD10-I70.0).   Past Medical History:  Diagnosis Date   Arthritis    GERD (gastroesophageal reflux disease)    Hyperlipidemia    Hypertension     Past Surgical History:  Procedure Laterality Date   APPENDECTOMY     COLON SURGERY     JOINT REPLACEMENT      Current Outpatient Medications  Medication Sig Dispense Refill   amLODipine  (NORVASC ) 10 MG tablet Take 10 mg by mouth in the morning.     atorvastatin  (LIPITOR) 20 MG tablet Take 20 mg by mouth every evening.     busPIRone  (BUSPAR ) 10 MG tablet Take 20 mg by mouth 2 (two) times daily.     docusate sodium  (COLACE) 100 MG capsule Take 100 mg by mouth 2 (two) times daily.     hydrochlorothiazide  (HYDRODIURIL ) 25 MG tablet Take 25 mg by mouth in the morning.     HYDROcodone -acetaminophen  (NORCO) 10-325 MG tablet Take 1 tablet by mouth every 6 (six) hours as needed.     hydrOXYzine  (ATARAX ) 25 MG tablet Take 25 mg by mouth at bedtime.     levETIRAcetam  (KEPPRA ) 500 MG tablet Take 500 mg by mouth 2 (two) times daily.     metoprolol  tartrate (LOPRESSOR ) 25 MG tablet Take 25 mg by mouth 2 (two) times daily.     omeprazole  (PRILOSEC) 40 MG capsule Take 40 mg by mouth in the morning and at bedtime.     potassium chloride  (KLOR-CON ) 20 MEQ packet Take 20 mEq by mouth 2 (two) times daily.     potassium chloride  SA (KLOR-CON  M) 20 MEQ tablet Take 1 tablet (20 mEq total) by mouth 2 (two) times daily. 4 tablet 0   pregabalin  (LYRICA ) 50 MG capsule Take 50 mg by mouth 3 (three) times daily.     QUEtiapine  (SEROQUEL ) 100 MG tablet Take 100 mg by mouth at bedtime.     sucralfate  (CARAFATE ) 1 g tablet Take 1 tablet (1 g total) by mouth 4 (four) times daily -  with meals and at bedtime. 120 tablet 0   aspirin  81 MG chewable  tablet Chew 81 mg by mouth in the morning. (Patient not taking: Reported on 05/16/2024)     desvenlafaxine (PRISTIQ) 50 MG 24 hr tablet Take 50 mg by mouth in the morning. (Patient not taking: Reported on 05/16/2024)     melatonin 3 MG TABS tablet Take 6 mg by mouth at bedtime.     No current facility-administered medications for this visit.    Allergies as of 05/16/2024 - Review Complete 05/16/2024  Allergen Reaction Noted   Penicillins Hives 05/13/2021   Latex Rash 05/13/2021   Sulfa antibiotics Itching 05/13/2021   Zinc  05/13/2021    Family History  Problem Relation Age of Onset   Liver disease Neg Hx    Esophageal cancer Neg Hx    Colon cancer Neg Hx  Social History   Tobacco Use   Smoking status: Every Day    Types: Cigarettes   Smokeless tobacco: Never  Vaping Use   Vaping status: Never Used  Substance Use Topics   Alcohol use: Yes    Comment: social   Drug use: Yes    Types: Marijuana     Review of Systems:    Constitutional: No fever, chills Skin: No rash or itching Cardiovascular: No chest pain, chest pressure or palpitations   Respiratory: No SOB or cough Gastrointestinal: See HPI and otherwise negative Neurological: No headache, dizziness or syncope Hematologic: No bleeding or bruising    Physical Exam:  Vital signs: BP 118/62   Pulse 63   Ht 5' 3 (1.6 m)   Wt 159 lb (72.1 kg)   BMI 28.17 kg/m   Wt Readings from Last 3 Encounters:  05/16/24 159 lb (72.1 kg)  10/02/23 179 lb 14.3 oz (81.6 kg)  01/17/22 180 lb (81.6 kg)     Constitutional: Pleasant female in NAD, alert and cooperative Head:  Normocephalic and atraumatic.  Eyes: No scleral icterus.  Mouth: No oral lesions. Respiratory: Respirations even and unlabored. Lungs clear to auscultation bilaterally.  No wheezes, crackles, or rhonchi.  Cardiovascular:  Regular rate and rhythm. No murmurs. No peripheral edema. Gastrointestinal:  Soft, nondistended, mild tenderness to palpation of  RUQ, RLQ. No rebound or guarding. Normal bowel sounds. No appreciable masses or hepatomegaly. Rectal:  Not performed.  Neurologic:  Alert and oriented x4;  grossly normal neurologically.  Skin:   Dry and intact without significant lesions or rashes. Psychiatric: Oriented to person, place and time. Demonstrates good judgement and reason without abnormal affect or behaviors.   RELEVANT LABS AND IMAGING: CBC    Component Value Date/Time   WBC 7.4 10/02/2023 1359   RBC 5.22 (H) 10/02/2023 1359   HGB 14.2 10/02/2023 1359   HCT 43.4 10/02/2023 1359   PLT 286 10/02/2023 1359   MCV 83.1 10/02/2023 1359   MCH 27.2 10/02/2023 1359   MCHC 32.7 10/02/2023 1359   RDW 14.0 10/02/2023 1359   LYMPHSABS 1.7 01/17/2022 1207   MONOABS 0.3 01/17/2022 1207   EOSABS 0.0 01/17/2022 1207   BASOSABS 0.1 01/17/2022 1207    CMP     Component Value Date/Time   NA 136 10/02/2023 1359   K 3.2 (L) 10/02/2023 1359   CL 100 10/02/2023 1359   CO2 25 10/02/2023 1359   GLUCOSE 117 (H) 10/02/2023 1359   BUN 7 (L) 10/02/2023 1359   CREATININE 0.82 10/02/2023 1359   CALCIUM  10.4 (H) 10/02/2023 1359   PROT 7.9 10/02/2023 1359   ALBUMIN 4.9 10/02/2023 1359   AST 32 10/02/2023 1359   ALT 25 10/02/2023 1359   ALKPHOS 89 10/02/2023 1359   BILITOT 0.7 10/02/2023 1359   GFRNONAA >60 10/02/2023 1359     Assessment/Plan:   Nausea and vomiting Dysphagia Globus sensation Decreased appetite Generalized abdominal pain Screening for colon cancer Patient seen today for complaint of intermittent nausea and vomiting, as well as intermittent abdominal pain.  Episodes of nausea and vomiting occur about once a month and can last 2 to 4 days.  Generalized/lower abdominal pain can occur after eating certain foods such as spicy foods and fried foods, sometimes relieved by bowel movement.  She denies any hematemesis, rectal bleeding, or melena. She has some intermittent dysphagia.  Does wear upper and lower dentures.  Food  goes down if she follows it with liquid.  Has globus  sensation.  Currently not on any antireflux medications.  She does have known cholelithiasis, but at visit to the ED in December there was no evidence of cholecystitis.  Some tenderness to palpation of right abdomen on exam today.  She has had no prior EGD or colonoscopy.  Has had decreased appetite and fear of eating due to symptoms.  Per chart review patient has lost about 20 pounds since December.  Reports history of stroke.  Per care everywhere, stroke occurred 12/2021. Taking aspirin  81 mg daily.  She denies any shortness of breath or chest pain.  History of seizures, on Keppra . Reports last seizure was 3 years ago. Sees [facility], last office visit 07/2023 and per note last seizure was a silent staring episode 10/2021. She has follow-up scheduled for 07/18/2024.   - Schedule EGD/colonoscopy. I thoroughly discussed the procedure with the patient to include nature of the procedure, alternatives, benefits, and risks (including but not limited to bleeding, infection, perforation, anesthesia/cardiac/pulmonary complications). Patient verbalized understanding and gave verbal consent to proceed with procedure.  - Will request neurology clearance. - Labs today: CBC, CMP - Start omeprazole  40 mg BID - Recommend marijuana cessation - Consider repeat abdominal imaging - If no endoscopic findings to explain symptoms, consider referral to surgery for possible biliary colic   Camie Furbish, PA-C [facility] 05/16/2024, 9:30 AM  Patient Care Team: Claudene Round, MD as PCP - General (Family Medicine)

## 2024-05-16 ENCOUNTER — Encounter: Payer: Self-pay | Admitting: Gastroenterology

## 2024-05-16 ENCOUNTER — Ambulatory Visit: Payer: Self-pay | Admitting: Gastroenterology

## 2024-05-16 ENCOUNTER — Other Ambulatory Visit (INDEPENDENT_AMBULATORY_CARE_PROVIDER_SITE_OTHER)

## 2024-05-16 ENCOUNTER — Ambulatory Visit (INDEPENDENT_AMBULATORY_CARE_PROVIDER_SITE_OTHER): Admitting: Gastroenterology

## 2024-05-16 VITALS — BP 118/62 | HR 63 | Ht 63.0 in | Wt 159.0 lb

## 2024-05-16 DIAGNOSIS — R1310 Dysphagia, unspecified: Secondary | ICD-10-CM

## 2024-05-16 DIAGNOSIS — R1084 Generalized abdominal pain: Secondary | ICD-10-CM

## 2024-05-16 DIAGNOSIS — Z1211 Encounter for screening for malignant neoplasm of colon: Secondary | ICD-10-CM

## 2024-05-16 DIAGNOSIS — R630 Anorexia: Secondary | ICD-10-CM

## 2024-05-16 DIAGNOSIS — R112 Nausea with vomiting, unspecified: Secondary | ICD-10-CM

## 2024-05-16 DIAGNOSIS — R09A2 Foreign body sensation, throat: Secondary | ICD-10-CM

## 2024-05-16 DIAGNOSIS — K5900 Constipation, unspecified: Secondary | ICD-10-CM

## 2024-05-16 LAB — CBC WITH DIFFERENTIAL/PLATELET
Basophils Absolute: 0 K/uL (ref 0.0–0.1)
Basophils Relative: 0.6 % (ref 0.0–3.0)
Eosinophils Absolute: 0.1 K/uL (ref 0.0–0.7)
Eosinophils Relative: 1.6 % (ref 0.0–5.0)
HCT: 41 % (ref 36.0–46.0)
Hemoglobin: 13.2 g/dL (ref 12.0–15.0)
Lymphocytes Relative: 31.8 % (ref 12.0–46.0)
Lymphs Abs: 2.1 K/uL (ref 0.7–4.0)
MCHC: 32.2 g/dL (ref 30.0–36.0)
MCV: 85.8 fl (ref 78.0–100.0)
Monocytes Absolute: 0.3 K/uL (ref 0.1–1.0)
Monocytes Relative: 4.6 % (ref 3.0–12.0)
Neutro Abs: 4 K/uL (ref 1.4–7.7)
Neutrophils Relative %: 61.4 % (ref 43.0–77.0)
Platelets: 253 K/uL (ref 150.0–400.0)
RBC: 4.78 Mil/uL (ref 3.87–5.11)
RDW: 14.5 % (ref 11.5–15.5)
WBC: 6.5 K/uL (ref 4.0–10.5)

## 2024-05-16 LAB — COMPREHENSIVE METABOLIC PANEL WITH GFR
ALT: 13 U/L (ref 0–35)
AST: 22 U/L (ref 0–37)
Albumin: 4.6 g/dL (ref 3.5–5.2)
Alkaline Phosphatase: 84 U/L (ref 39–117)
BUN: 13 mg/dL (ref 6–23)
CO2: 25 meq/L (ref 19–32)
Calcium: 9.7 mg/dL (ref 8.4–10.5)
Chloride: 104 meq/L (ref 96–112)
Creatinine, Ser: 0.81 mg/dL (ref 0.40–1.20)
GFR: 72.65 mL/min (ref >60.00)
Glucose, Bld: 103 mg/dL — ABNORMAL HIGH (ref 70–99)
Potassium: 3.8 meq/L (ref 3.5–5.1)
Sodium: 139 meq/L (ref 135–145)
Total Bilirubin: 0.3 mg/dL (ref 0.2–1.2)
Total Protein: 7.4 g/dL (ref 6.0–8.3)

## 2024-05-16 MED ORDER — OMEPRAZOLE 40 MG PO CPDR: 40.0000 mg | DELAYED_RELEASE_CAPSULE | Freq: Two times a day (BID) | ORAL | 3 refills | Status: DC

## 2024-05-16 MED ORDER — NA SULFATE-K SULFATE-MG SULF 17.5-3.13-1.6 GM/177ML PO SOLN: 1.0000 | Freq: Once | ORAL | 0 refills | Status: AC

## 2024-05-16 NOTE — Patient Instructions (Addendum)
 You have been scheduled for an endoscopy and colonoscopy. Please follow the written instructions given to you at your visit today.  If you use inhalers (even only as needed), please bring them with you on the day of your procedure.  DO NOT TAKE 7 DAYS PRIOR TO TEST- Trulicity (dulaglutide) Ozempic, Wegovy (semaglutide) Mounjaro (tirzepatide) Bydureon Bcise (exanatide extended release)  DO NOT TAKE 1 DAY PRIOR TO YOUR TEST Rybelsus (semaglutide) Adlyxin (lixisenatide) Victoza (liraglutide) Byetta (exanatide) ___________________________________________________________________________  Your provider has requested that you go to the basement level for lab work before leaving today. Press B on the elevator. The lab is located at the first door on the left as you exit the elevator.  We have sent the following medications to your pharmacy for you to pick up at your convenience: SUPREP  Omeprazole 40 mg   AVOID NSAIDS  Recommend Marijuana cessation     Due to recent changes in healthcare laws, you may see the results of your imaging and laboratory studies on MyChart before your provider has had a chance to review them.  We understand that in some cases there may be results that are confusing or concerning to you. Not all laboratory results come back in the same time frame and the provider may be waiting for multiple results in order to interpret others.  Please give us  48 hours in order for your provider to thoroughly review all the results before contacting the office for clarification of your results.    I appreciate the  opportunity to care for you  Thank You   Camie Heinz,PA-C

## 2024-05-17 ENCOUNTER — Encounter: Payer: Self-pay | Admitting: Gastroenterology

## 2024-05-19 ENCOUNTER — Telehealth: Payer: Self-pay | Admitting: *Deleted

## 2024-05-19 ENCOUNTER — Encounter: Payer: Self-pay | Admitting: *Deleted

## 2024-05-19 NOTE — Telephone Encounter (Signed)
  Dorie Ohms 1951/08/14 [id number]  05/19/2024   Dear Sherlean Westley Mandril MD:  We have scheduled the above named patient for a(n) Colonoscopy/Endoscopy procedure. Our records Indicates she may need a Neurology clearance.    Please advise as to whether the patient by Neurology standpoint is safe to continue with her procedures.   Please route your response to Grayce Bettie LATHER or fax response to [phone].  Sincerely,    [facility]

## 2024-05-21 NOTE — Progress Notes (Signed)
 ____________________________________________________________  Attending physician addendum:  Thank you for sending this case to me. I have reviewed the entire note and agree with the plan.  Agree difficult to tell if biliary colic is any part of this  Victory Brand, MD  ____________________________________________________________

## 2024-06-13 NOTE — Telephone Encounter (Signed)
 This has been faxed twice with confirmation in my red anti-coag folder. I called Dr Lawton office and spoke to Nurse and she will get with the doctor and fax answer back to us . Patients procedure is 06/25/2024. This is just for a Neurology clearance  No medication involved

## 2024-06-19 NOTE — Telephone Encounter (Signed)
 RN from Dr Lawton called and said they never got our fax. She gave me another # [phone] to fax the letter again to them to get neurology clearance. I got confirmation that this went thru.

## 2024-06-20 NOTE — Telephone Encounter (Signed)
 I spoke with Lashanda and she faxed me the completed form stating the following: Ok for patient to undergo GI procedure as long as she takes her seizure medications on time.I will send the form to be scanned into epic. I spoke with Inocente and informed her of what Dr Lawton said for her to do and she verbalized understanding to take her seizure medications on time.

## 2024-06-20 NOTE — Telephone Encounter (Signed)
 I called Dr Lawton office at [phone] and spoke with [facility]. She checked with Lashanda and confirmed they got the fax yesterday. The clearance is not done yet but hopefully soon as they are aware we need it asap.

## 2024-06-23 NOTE — Telephone Encounter (Signed)
 Noted that patient is aware.. Thank You PJ!!

## 2024-06-25 ENCOUNTER — Ambulatory Visit (AMBULATORY_SURGERY_CENTER): Admitting: Gastroenterology

## 2024-06-25 ENCOUNTER — Other Ambulatory Visit: Payer: Self-pay | Admitting: Gastroenterology

## 2024-06-25 ENCOUNTER — Encounter: Payer: Self-pay | Admitting: Gastroenterology

## 2024-06-25 VITALS — BP 161/84 | HR 96 | Temp 97.6°F | Resp 12 | Ht 63.0 in | Wt 159.0 lb

## 2024-06-25 DIAGNOSIS — K529 Noninfective gastroenteritis and colitis, unspecified: Secondary | ICD-10-CM

## 2024-06-25 DIAGNOSIS — R197 Diarrhea, unspecified: Secondary | ICD-10-CM

## 2024-06-25 DIAGNOSIS — K319 Disease of stomach and duodenum, unspecified: Secondary | ICD-10-CM | POA: Diagnosis not present

## 2024-06-25 DIAGNOSIS — R112 Nausea with vomiting, unspecified: Secondary | ICD-10-CM

## 2024-06-25 DIAGNOSIS — K648 Other hemorrhoids: Secondary | ICD-10-CM

## 2024-06-25 DIAGNOSIS — K6289 Other specified diseases of anus and rectum: Secondary | ICD-10-CM | POA: Diagnosis not present

## 2024-06-25 DIAGNOSIS — K449 Diaphragmatic hernia without obstruction or gangrene: Secondary | ICD-10-CM | POA: Diagnosis not present

## 2024-06-25 DIAGNOSIS — Z1211 Encounter for screening for malignant neoplasm of colon: Secondary | ICD-10-CM | POA: Insufficient documentation

## 2024-06-25 DIAGNOSIS — K3189 Other diseases of stomach and duodenum: Secondary | ICD-10-CM | POA: Diagnosis not present

## 2024-06-25 DIAGNOSIS — K209 Esophagitis, unspecified without bleeding: Secondary | ICD-10-CM | POA: Diagnosis not present

## 2024-06-25 DIAGNOSIS — R1084 Generalized abdominal pain: Secondary | ICD-10-CM

## 2024-06-25 MED ORDER — SODIUM CHLORIDE 0.9 % IV SOLN: 500.0000 mL | Freq: Once | INTRAVENOUS | Status: DC

## 2024-06-25 MED ORDER — SODIUM CHLORIDE 0.9 % IV SOLN
4.0000 mg | Freq: Once | INTRAVENOUS | Status: AC
  Administered 2024-06-25: 4 mg via INTRAVENOUS

## 2024-06-25 MED ORDER — ONDANSETRON HCL 4 MG PO TABS: 4.0000 mg | ORAL_TABLET | Freq: Three times a day (TID) | ORAL | 1 refills | Status: AC | PRN

## 2024-06-25 NOTE — Progress Notes (Signed)
 Pt's states no medical or surgical changes since previsit or office visit.

## 2024-06-25 NOTE — Patient Instructions (Signed)

## 2024-06-25 NOTE — Progress Notes (Signed)
1016 Robinul 0.1 mg IV given due large amount of secretions upon assessment.  MD made aware, vss 

## 2024-06-25 NOTE — Op Note (Signed)
 [facility] Patient Name: Yolanda Keith Procedure Date: 06/25/2024 10:16 AM MRN: [id number] Endoscopist: Victory L. Legrand , MD, [id number] Age: 73 Referring MD:  Date of Birth: 1951-04-24 Gender: Female Account #: [id number] Procedure:                Upper GI endoscopy Indications:              Generalized abdominal pain, Nausea with vomiting                           Clinical details in recent office consult note Medicines:                Monitored Anesthesia Care Procedure:                Pre-Anesthesia Assessment:                           - Prior to the procedure, a History and Physical                            was performed, and patient medications and                            allergies were reviewed. The patient's tolerance of                            previous anesthesia was also reviewed. The risks                            and benefits of the procedure and the sedation                            options and risks were discussed with the patient.                            All questions were answered, and informed consent                            was obtained. Prior Anticoagulants: The patient has                            taken no anticoagulant or antiplatelet agents. ASA                            Grade Assessment: III - A patient with severe                            systemic disease. After reviewing the risks and                            benefits, the patient was deemed in satisfactory                            condition to undergo the procedure.  After obtaining informed consent, the endoscope was                            passed under direct vision. Throughout the                            procedure, the patient's blood pressure, pulse, and                            oxygen saturations were monitored continuously. The                            GIF [id number] #[id number] was introduced through the                            mouth,  and advanced to the second part of duodenum.                            The upper GI endoscopy was accomplished without                            difficulty. The patient tolerated the procedure                            well. Scope In: Scope Out: Findings:                 A 3 cm hiatal hernia was present.                           LA Grade A (one or more mucosal breaks less than 5                            mm, not extending between tops of 2 mucosal folds)                            esophagitis was found at the gastroesophageal                            junction.                           Erythematous mucosa was found on the anterior wall                            of the gastric body and on the posterior wall of                            the gastric body (more prominent diffuse on the                            anterior wall). Several biopsies were obtained in  the gastric body and in the gastric antrum with                            cold forceps for histology (separate jars from the                            antrum and body to rule out H. pylori).                           The cardia and gastric fundus were normal on                            retroflexion. Stomach distended well with                            insufflation.                           Normal mucosa was found in the entire duodenum.                            Biopsies for histology were taken with a cold                            forceps for evaluation of celiac disease. Complications:            No immediate complications. Estimated Blood Loss:     Estimated blood loss was minimal. Impression:               - 3 cm hiatal hernia.                           - LA Grade A esophagitis.                           - Erythematous mucosa in the anterior wall of the                            gastric body and posterior wall of the gastric body.                           - Normal mucosa was found  in the entire examined                            duodenum. Biopsied.                           - Several biopsies were obtained in the gastric                            body and in the gastric antrum.                           Mild esophagitis may be the result of vomiting  rather than GERD.                           Gastric mucosal findings may be the effect of this                            patient's persistent vomiting rather than the                            cause, particularly if biopsies negative for H.                            pylori. Recommendation:           - Patient has a contact number available for                            emergencies. The signs and symptoms of potential                            delayed complications were discussed with the                            patient. Return to normal activities tomorrow.                            Written discharge instructions were provided to the                            patient.                           - Resume previous diet.                           - Continue present medications.                           - Await pathology results.                           - See the other procedure note for documentation of                            additional recommendations.                           If biopsies negative for H. pylori or other clear                            cause of symptoms, schedule gastric emptying study                           Unknown to what extent regular use of THC may be  contributing to the symptoms with the possibility                            of cannabis hyperemesis. As such, complete                            cessation of that is strongly recommended to help                            sort out the cause of the symptoms.                           If all testing above unrevealing and symptoms                            persist, surgical  consultation for consideration of                            cholecystectomy with gallstones having been seen on                            imaging. Donelda Mailhot L. Legrand, MD 06/25/2024 11:00:16 AM This report has been signed electronically.

## 2024-06-25 NOTE — Progress Notes (Signed)
 Report given to PACU, vss

## 2024-06-25 NOTE — Progress Notes (Signed)
 Called to room to assist during endoscopic procedure.  Patient ID and intended procedure confirmed with present staff. Received instructions for my participation in the procedure from the performing physician.

## 2024-06-25 NOTE — Op Note (Signed)
 [facility] Patient Name: Yolanda Keith Procedure Date: 06/25/2024 10:15 AM MRN: [id number] Endoscopist: Victory L. Legrand , MD, [id number] Age: 73 Referring MD:  Date of Birth: [birth year] Gender: Female Account #: [id number] Procedure:                Colonoscopy Indications:              Generalized abdominal pain, Chronic (intermittent)                            diarrhea Medicines:                Monitored Anesthesia Care Procedure:                Pre-Anesthesia Assessment:                           - Prior to the procedure, a History and Physical                            was performed, and patient medications and                            allergies were reviewed. The patient's tolerance of                            previous anesthesia was also reviewed. The risks                            and benefits of the procedure and the sedation                            options and risks were discussed with the patient.                            All questions were answered, and informed consent                            was obtained. Prior Anticoagulants: The patient has                            taken no anticoagulant or antiplatelet agents. ASA                            Grade Assessment: III - A patient with severe                            systemic disease. After reviewing the risks and                            benefits, the patient was deemed in satisfactory                            condition to undergo the procedure.  After obtaining informed consent, the colonoscope                            was passed under direct vision. Throughout the                            procedure, the patient's blood pressure, pulse, and                            oxygen saturations were monitored continuously. The                            Olympus CF-HQ190L ([id number]) Colonoscope was                            introduced through the anus and advanced to the  the                            cecum, identified by appendiceal orifice and                            ileocecal valve. The colonoscopy was performed                            without difficulty. The patient tolerated the                            procedure well. The quality of the bowel                            preparation was good. The ileocecal valve,                            appendiceal orifice, and rectum were photographed. Scope In: 10:33:21 AM Scope Out: 10:45:50 AM Scope Withdrawal Time: 0 hours 9 minutes 28 seconds  Total Procedure Duration: 0 hours 12 minutes 29 seconds  Findings:                 The digital rectal exam findings include decreased                            sphincter tone.                           Repeat examination of right colon under NBI                            performed.                           Multiple diverticula were found in the left colon                            and right colon.  Internal hemorrhoids were found. The hemorrhoids                            were small.                           Normal mucosa was found in the entire colon.                            Biopsies for histology were taken with a cold                            forceps from the right colon and left colon for                            evaluation of microscopic colitis.                           The exam was otherwise without abnormality on                            direct and retroflexion views. Complications:            No immediate complications. Estimated Blood Loss:     Estimated blood loss was minimal. Impression:               - Decreased sphincter tone found on digital rectal                            exam.                           - Diverticulosis in the left colon and in the right                            colon.                           - Internal hemorrhoids.                           - Normal mucosa in the entire examined  colon.                            Biopsied.                           - The examination was otherwise normal on direct                            and retroflexion views. Recommendation:           - Patient has a contact number available for                            emergencies. The signs and symptoms of potential  delayed complications were discussed with the                            patient. Return to normal activities tomorrow.                            Written discharge instructions were provided to the                            patient.                           - Resume previous diet.                           - Continue present medications.                           - Await pathology results.                           - No repeat screening colonoscopy (or other CRC                            screening tests) due to age and no polyps found                            today.                           - See the other procedure note for documentation of                            additional recommendations. Keymari Sato L. Legrand, MD 06/25/2024 11:05:10 AM This report has been signed electronically.

## 2024-06-25 NOTE — Progress Notes (Signed)
 History and Physical:  This patient presents for endoscopic testing for: Encounter Diagnoses  Name Primary?   Nausea and vomiting, unspecified vomiting type Yes   Lower abdominal pain     This 73 year old woman here today for endoscopic evaluation of multiple GI symptoms. Clinical details of this can be found in the 05/16/2024 APP consultation note, with no significant clinical changes since then.  This patient has some chronic lower abdominal discomfort that occurs around the time of bowel movements.  She also has episodes of severe more generalized abdominal discomfort that is often associated with nausea and vomiting.  She has gallstones seen on a CT abdomen pelvis from December 2024 ED visits.  It is not yet clear to what extent those gallstones may be related to the symptoms given the diffuse nature of the pain and the fact that tenderness was primarily lower abdominal on office consultation. She also uses marijuana regularly and was advised to discontinue that at the recent office visit. She had a difficult time tolerating the bowel preparation and arrived in the endoscopy lab this morning vomiting and feeling weak.  Very difficult for CRNA to obtain an IV today  Patient is otherwise without complaints or active issues today.   Past Medical History: Past Medical History:  Diagnosis Date   Arthritis    GERD (gastroesophageal reflux disease)    Hyperlipidemia    Hypertension      Past Surgical History: Past Surgical History:  Procedure Laterality Date   APPENDECTOMY     COLON SURGERY     JOINT REPLACEMENT      Allergies: Allergies  Allergen Reactions   Penicillins Hives   Latex Rash   Sulfa Antibiotics Itching        Zinc Other (See Comments)    Shakes, restlessness    Outpatient Meds: Current Outpatient Medications  Medication Sig Dispense Refill   amLODipine  (NORVASC ) 10 MG tablet Take 10 mg by mouth in the morning.     atorvastatin  (LIPITOR) 20 MG tablet  Take 20 mg by mouth every evening.     busPIRone  (BUSPAR ) 10 MG tablet Take 20 mg by mouth 2 (two) times daily.     docusate sodium  (COLACE) 100 MG capsule Take 100 mg by mouth 2 (two) times daily.     hydrochlorothiazide  (HYDRODIURIL ) 25 MG tablet Take 25 mg by mouth in the morning.     hydrOXYzine  (ATARAX ) 25 MG tablet Take 25 mg by mouth at bedtime.     levETIRAcetam  (KEPPRA ) 500 MG tablet Take 500 mg by mouth 2 (two) times daily.     melatonin 3 MG TABS tablet Take 6 mg by mouth at bedtime.     metoprolol  tartrate (LOPRESSOR ) 25 MG tablet Take 25 mg by mouth 2 (two) times daily.     omeprazole  (PRILOSEC) 40 MG capsule Take 1 capsule (40 mg total) by mouth in the morning and at bedtime. 180 capsule 3   pregabalin  (LYRICA ) 50 MG capsule Take 50 mg by mouth 3 (three) times daily.     QUEtiapine  (SEROQUEL ) 100 MG tablet Take 100 mg by mouth at bedtime.     sucralfate  (CARAFATE ) 1 g tablet Take 1 tablet (1 g total) by mouth 4 (four) times daily -  with meals and at bedtime. 120 tablet 0   aspirin  81 MG chewable tablet Chew 81 mg by mouth in the morning. (Patient not taking: No sig reported)     desvenlafaxine (PRISTIQ) 50 MG 24 hr tablet Take 50 mg  by mouth in the morning. (Patient not taking: No sig reported)     HYDROcodone -acetaminophen  (NORCO) 10-325 MG tablet Take 1 tablet by mouth every 6 (six) hours as needed.     potassium chloride  (KLOR-CON ) 20 MEQ packet Take 20 mEq by mouth 2 (two) times daily.     potassium chloride  SA (KLOR-CON  M) 20 MEQ tablet Take 1 tablet (20 mEq total) by mouth 2 (two) times daily. 4 tablet 0   Current Facility-Administered Medications  Medication Dose Route Frequency Provider Last Rate Last Admin   0.9 %  sodium chloride  infusion  500 mL Intravenous Once Danis, Victory CROME III, MD       ondansetron  (ZOFRAN ) 4 mg in sodium chloride  0.9 % 50 mL IVPB  4 mg Intravenous Once Danis, Victory CROME MOULD, MD           ___________________________________________________________________ Objective   Exam:  BP (!) 142/97   Pulse (!) 114   Temp 97.6 F (36.4 C) (Temporal)   Ht 5' 3 (1.6 m)   Wt 159 lb (72.1 kg)   SpO2 100%   BMI 28.17 kg/m   CV: regular , S1/S2 Resp: clear to auscultation bilaterally, normal RR and effort noted GI: soft, no tenderness, with active bowel sounds.   Assessment: Encounter Diagnoses  Name Primary?   Nausea and vomiting, unspecified vomiting type Yes   Lower abdominal pain      Plan: Colonoscopy EGD  The benefits and risks of the planned procedure(s) were described in detail with the patient or (when appropriate) their health care proxy.  Risks were outlined as including, but not limited to, bleeding, infection, perforation, adverse medication reaction leading to cardiac or pulmonary decompensation, pancreatitis (if ERCP).  The limitation of incomplete mucosal visualization was also discussed.  No guarantees or warranties were given.  The patient is appropriate for an endoscopic procedure in the ambulatory setting.   - Victory Brand, MD

## 2024-06-26 ENCOUNTER — Telehealth: Payer: Self-pay

## 2024-06-26 NOTE — Telephone Encounter (Signed)
 Left message

## 2024-06-30 ENCOUNTER — Ambulatory Visit: Payer: Self-pay | Admitting: Gastroenterology

## 2024-06-30 LAB — SURGICAL PATHOLOGY

## 2024-07-27 NOTE — Progress Notes (Deleted)
 Yolanda Keith [id number] 01-24-1951   Chief Complaint:  Referring Provider: Claudene Round, MD Primary GI MD: Dr. Legrand  HPI: Yolanda Keith is a 73 y.o. female with past medical history of arthritis, GERD, HLD, HTN, appendectomy, DVT, stroke, seizure disorder on Keppra  who presents today for follow up.    09/26/2023 patient seen in the ED for complaint of 1 week of nausea and vomiting and associated LLQ abdominal pain.  Reported 1 week prior she had a viral illness which resolved but abdominal pain persisted along with nausea and vomiting.  Denied hematemesis.  Labs unremarkable.  Negative UA.  CT A/P showed diverticulosis with no evidence of acute diverticulitis.  She was treated with Toradol and Zofran  with improvement in symptoms.   10/02/2023 patient seen again in the ED with persistent nausea, vomiting, and abdominal discomfort.  Reported 2 weeks of burning in her abdomen especially after eating.  Denied constipation or diarrhea, reported small bowel movements.  She had been started on Flagyl  following last ED visit.  Was on Prilosec already but denied seeing a GI doctor and denied prior endoscopy. Lipase was normal, CBC showed no leukocytosis and no anemia.  No infection seen on urinalysis.  Metabolic panel showed low potassium and elevated calcium  but otherwise normal with normal liver and kidney function.  CT showed diverticulosis without diverticulitis, as well as gallstones with no acute cholecystitis seen.  No obstruction or evidence of significant pathology in the abdomen and pelvis. Clinical suspicion for reflux versus ulceration versus gastritis.  She was given a GI cocktail and started on Carafate  in addition to the Prilosec she took at home.  Advised to follow-up with outpatient GI.  Last seen in office 05/16/2024 for multiple GI complaints including intermittent nausea and vomiting, intermittent abdominal pain.  Episodes occurring about once a month and lasting 2 to 4 days.   Abdominal pain generally occurring after eating certain foods, sometimes relieved by bowel movement.  Also endorsed intermittent dysphagia, globus sensation.  Patient has known cholelithiasis, unclear if experiencing biliary colic.  Had lost about 20 pounds since December.  Was scheduled for EGD/colonoscopy, advised to avoid marijuana.  Normal CBC, CMP on 05/16/2024.  She underwent EGD/colonoscopy 06/25/2024.  Colonoscopy showed diverticulosis, internal hemorrhoids, and decrease sphincter tone on DRE, and was otherwise normal with no recommended recall due to age. On EGD found to have a 3 cm hiatal hernia, LA grade a esophagitis, erythematous mucosa in the gastric body.  Biopsies benign and negative for H. pylori.  Per procedure report, mild esophagitis may be the result of vomiting rather than GERD.  Gastric mucosal findings may be the effect of this patient's persistent vomiting rather than the cause, particularly if biopsies negative for H. pylori.   If biopsies negative for H. pylori or other clear cause of symptoms, schedule gastric emptying study. Unknown to what extent regular use of THC may be contributing to the symptoms with the possibility of cannabis hyperemesis. As such, complete cessation of that is strongly recommended to help sort out the cause of the symptoms. If all testing above unrevealing and symptoms persist, surgical consultation for consideration of cholecystectomy with gallstones having been seen on imaging.  Previous GI Procedures/Imaging   EGD 06/25/2024 - 3 cm hiatal hernia.  - LA Grade A esophagitis. - Erythematous mucosa in the anterior wall of the gastric body and posterior wall of the gastric body.  - Normal mucosa was found in the entire examined duodenum. Biopsied.  - Several biopsies were obtained  in the gastric body and in the gastric antrum. Path: 1. Surgical [P], duodenal :      -  DUODENAL MUCOSA WITH NO SIGNIFICANT PATHOLOGY.       2. Surgical [P], gastric  antrum :      -  ANTRAL AND OXYNTIC MUCOSA WITH CHEMICAL/REACTIVE CHANGE.      -  NO HELICOBACTER PYLORI ORGANISMS IDENTIFIED ON H&E STAINED SLIDE.       3. Surgical [P], gastric body :      -  OXYNTIC MUCOSA WITH MILD CHEMICAL/REACTIVE CHANGE.      -  NO HELICOBACTER PYLORI ORGANISMS IDENTIFIED ON H&E STAINED SLIDE.   Mild esophagitis may be the result of vomiting rather than GERD. Gastric mucosal findings may be the effect of this patient' s persistent vomiting rather than the cause, particularly if biopsies negative for H. pylori.  Colonoscopy 06/25/2024 - Decreased sphincter tone found on digital rectal exam.  - Diverticulosis in the left colon and in the right colon.  - Internal hemorrhoids.  - Normal mucosa in the entire examined colon. Biopsied.  - The examination was otherwise normal on direct and retroflexion views. - No recall due to age Path:      4. Surgical [P], right colon and left colon biopsy :      -  COLONIC MUCOSA WITH NO SIGNIFICANT PATHOLOGY.   CT A/P 10/02/2023 1. No acute intra-abdominal or pelvic pathology. 2. Sigmoid diverticulosis. No bowel obstruction. 3. Cholelithiasis. 4. Aortic Atherosclerosis (ICD10-I70.0).   Past Medical History:  Diagnosis Date   Arthritis    GERD (gastroesophageal reflux disease)    Hyperlipidemia    Hypertension    Seizures (HCC)     Past Surgical History:  Procedure Laterality Date   APPENDECTOMY     COLON SURGERY     JOINT REPLACEMENT      Current Outpatient Medications  Medication Sig Dispense Refill   amLODipine  (NORVASC ) 10 MG tablet Take 10 mg by mouth in the morning.     aspirin  81 MG chewable tablet Chew 81 mg by mouth in the morning. (Patient not taking: No sig reported)     atorvastatin  (LIPITOR) 20 MG tablet Take 20 mg by mouth every evening.     busPIRone  (BUSPAR ) 10 MG tablet Take 20 mg by mouth 2 (two) times daily.     desvenlafaxine (PRISTIQ) 50 MG 24 hr tablet Take 50 mg by mouth in the morning.  (Patient not taking: No sig reported)     docusate sodium  (COLACE) 100 MG capsule Take 100 mg by mouth 2 (two) times daily.     hydrochlorothiazide  (HYDRODIURIL ) 25 MG tablet Take 25 mg by mouth in the morning.     HYDROcodone -acetaminophen  (NORCO) 10-325 MG tablet Take 1 tablet by mouth every 6 (six) hours as needed.     hydrOXYzine  (ATARAX ) 25 MG tablet Take 25 mg by mouth at bedtime.     levETIRAcetam  (KEPPRA ) 500 MG tablet Take 500 mg by mouth 2 (two) times daily.     melatonin 3 MG TABS tablet Take 6 mg by mouth at bedtime.     metoprolol  tartrate (LOPRESSOR ) 25 MG tablet Take 25 mg by mouth 2 (two) times daily.     omeprazole  (PRILOSEC) 40 MG capsule Take 1 capsule (40 mg total) by mouth in the morning and at bedtime. 180 capsule 3   ondansetron  (ZOFRAN ) 4 MG tablet Take 1 tablet (4 mg total) by mouth every 8 (eight) hours as needed for nausea  or vomiting. 30 tablet 1   potassium chloride  (KLOR-CON ) 20 MEQ packet Take 20 mEq by mouth 2 (two) times daily.     potassium chloride  SA (KLOR-CON  M) 20 MEQ tablet Take 1 tablet (20 mEq total) by mouth 2 (two) times daily. 4 tablet 0   pregabalin  (LYRICA ) 50 MG capsule Take 50 mg by mouth 3 (three) times daily.     QUEtiapine  (SEROQUEL ) 100 MG tablet Take 100 mg by mouth at bedtime.     sucralfate  (CARAFATE ) 1 g tablet Take 1 tablet (1 g total) by mouth 4 (four) times daily -  with meals and at bedtime. 120 tablet 0   No current facility-administered medications for this visit.    Allergies as of 07/28/2024 - Review Complete 06/25/2024  Allergen Reaction Noted   Penicillins Hives 05/13/2021   Latex Rash 05/13/2021   Sulfa antibiotics Itching 05/13/2021   Zinc Other (See Comments) 05/13/2021    Family History  Problem Relation Age of Onset   Liver disease Neg Hx    Esophageal cancer Neg Hx    Colon cancer Neg Hx     Social History   Tobacco Use   Smoking status: Every Day    Types: Cigarettes   Smokeless tobacco: Never  Vaping Use    Vaping status: Never Used  Substance Use Topics   Alcohol use: Yes    Comment: social   Drug use: Yes    Types: Marijuana     Review of Systems:    Constitutional: No weight loss, fever, chills, weakness or fatigue Eyes: No change in vision Ears, Nose, Throat:  No change in hearing or congestion Skin: No rash or itching Cardiovascular: No chest pain, chest pressure or palpitations   Respiratory: No SOB or cough Gastrointestinal: See HPI and otherwise negative Genitourinary: No dysuria or change in urinary frequency Neurological: No headache, dizziness or syncope Musculoskeletal: No new muscle or joint pain Hematologic: No bleeding or bruising    Physical Exam:  Vital signs: There were no vitals taken for this visit.  Constitutional: NAD, Well developed, Well nourished, alert and cooperative Head:  Normocephalic and atraumatic.  Eyes: No scleral icterus. Conjunctiva pink. Mouth: No oral lesions. Respiratory: Respirations even and unlabored. Lungs clear to auscultation bilaterally.  No wheezes, crackles, or rhonchi.  Cardiovascular:  Regular rate and rhythm. No murmurs. No peripheral edema. Gastrointestinal:  Soft, nondistended, nontender. No rebound or guarding. Normal bowel sounds. No appreciable masses or hepatomegaly. Rectal:  Not performed.  Neurologic:  Alert and oriented x4;  grossly normal neurologically.  Skin:   Dry and intact without significant lesions or rashes. Psychiatric: Oriented to person, place and time. Demonstrates good judgement and reason without abnormal affect or behaviors.   RELEVANT LABS AND IMAGING: CBC    Component Value Date/Time   WBC 6.5 05/16/2024 1033   RBC 4.78 05/16/2024 1033   HGB 13.2 05/16/2024 1033   HCT 41.0 05/16/2024 1033   PLT 253.0 05/16/2024 1033   MCV 85.8 05/16/2024 1033   MCH 27.2 10/02/2023 1359   MCHC 32.2 05/16/2024 1033   RDW 14.5 05/16/2024 1033   LYMPHSABS 2.1 05/16/2024 1033   MONOABS 0.3 05/16/2024 1033    EOSABS 0.1 05/16/2024 1033   BASOSABS 0.0 05/16/2024 1033    CMP     Component Value Date/Time   NA 139 05/16/2024 1033   K 3.8 05/16/2024 1033   CL 104 05/16/2024 1033   CO2 25 05/16/2024 1033   GLUCOSE 103 (H) 05/16/2024  1033   BUN 13 05/16/2024 1033   CREATININE 0.81 05/16/2024 1033   CALCIUM  9.7 05/16/2024 1033   PROT 7.4 05/16/2024 1033   ALBUMIN 4.6 05/16/2024 1033   AST 22 05/16/2024 1033   ALT 13 05/16/2024 1033   ALKPHOS 84 05/16/2024 1033   BILITOT 0.3 05/16/2024 1033   GFRNONAA >60 10/02/2023 1359     Assessment/Plan:    - Recommend marijuana cessation - Gastric emptying study - If study is normal, refer to surgery for consideration of cholecystectomy  If biopsies negative for H. pylori or other clear cause of symptoms, schedule gastric emptying study Unknown to what extent regular use of THC may be contributing to the symptoms with the possibility of cannabis hyperemesis. As such, complete cessation of that is strongly recommended to help sort out the cause of the symptoms. If all testing above unrevealing and symptoms persist, surgical consultation for consideration of cholecystectomy with gallstones having been seen on imaging.   Camie Furbish, PA-C [facility] 07/27/2024, 6:40 PM  Patient Care Team: Claudene Round, MD as PCP - General (Family Medicine)

## 2024-07-28 ENCOUNTER — Ambulatory Visit: Admitting: Gastroenterology

## 2024-08-14 ENCOUNTER — Ambulatory Visit: Admitting: Gastroenterology

## 2024-08-14 ENCOUNTER — Encounter: Payer: Self-pay | Admitting: Gastroenterology

## 2024-08-14 VITALS — BP 114/60 | HR 71 | Ht 63.5 in | Wt 164.1 lb

## 2024-08-14 DIAGNOSIS — R140 Abdominal distension (gaseous): Secondary | ICD-10-CM

## 2024-08-14 DIAGNOSIS — R1084 Generalized abdominal pain: Secondary | ICD-10-CM

## 2024-08-14 DIAGNOSIS — R112 Nausea with vomiting, unspecified: Secondary | ICD-10-CM
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
## 2024-08-14 NOTE — Patient Instructions (Signed)
 We have given you samples of the following medication to take: Fdgard   Thank you for trusting me with your gastrointestinal care!    Dr. Victory Legrand [facility]

## 2024-08-26 ENCOUNTER — Other Ambulatory Visit: Payer: Self-pay | Admitting: Gastroenterology
